# Patient Record
Sex: Female | Born: 1960 | Race: White | Hispanic: No | Marital: Single | Smoking: Never smoker
Health system: Southern US, Community
[De-identification: ages and names within clinical notes are randomized; demographics above are authoritative.]

## PROBLEM LIST (undated history)

## (undated) DIAGNOSIS — J45990 Exercise induced bronchospasm: Secondary | ICD-10-CM

## (undated) DIAGNOSIS — J309 Allergic rhinitis, unspecified: Secondary | ICD-10-CM

## (undated) DIAGNOSIS — R002 Palpitations: Secondary | ICD-10-CM

## (undated) HISTORY — PX: TONSILLECTOMY: SHX5217

## (undated) HISTORY — DX: Exercise induced bronchospasm: J45.990

## (undated) HISTORY — DX: Allergic rhinitis, unspecified: J30.9

## (undated) HISTORY — DX: Palpitations: R00.2

## (undated) HISTORY — PX: OTHER SURGICAL HISTORY: SHX169

---

## 1983-03-31 HISTORY — PX: TONSILLECTOMY: SHX5217

## 2003-08-15 ENCOUNTER — Encounter: Admission: RE | Admit: 2003-08-15 | Discharge: 2003-08-15 | Payer: Self-pay | Admitting: Family Medicine

## 2003-08-15 ENCOUNTER — Encounter (INDEPENDENT_AMBULATORY_CARE_PROVIDER_SITE_OTHER): Payer: Self-pay | Admitting: Specialist

## 2004-12-16 ENCOUNTER — Ambulatory Visit (HOSPITAL_COMMUNITY): Admission: RE | Admit: 2004-12-16 | Discharge: 2004-12-16 | Payer: Self-pay | Admitting: Family Medicine

## 2005-01-30 ENCOUNTER — Ambulatory Visit (HOSPITAL_COMMUNITY): Admission: RE | Admit: 2005-01-30 | Discharge: 2005-01-30 | Payer: Self-pay | Admitting: Obstetrics & Gynecology

## 2005-01-30 ENCOUNTER — Encounter (INDEPENDENT_AMBULATORY_CARE_PROVIDER_SITE_OTHER): Payer: Self-pay | Admitting: Specialist

## 2008-03-30 HISTORY — PX: BREAST BIOPSY: SHX20

## 2008-05-23 ENCOUNTER — Encounter: Admission: RE | Admit: 2008-05-23 | Discharge: 2008-05-23 | Payer: Self-pay | Admitting: Family Medicine

## 2009-04-11 LAB — HM PAP SMEAR: HM Pap smear: NORMAL

## 2009-07-28 LAB — HM MAMMOGRAPHY

## 2010-08-15 NOTE — Op Note (Signed)
Kristen Wilkerson, Kristen Wilkerson               ACCOUNT NO.:  [id number]   MEDICAL RECORD NO.:  [id number]          PATIENT TYPE:  AMB   LOCATION:  SDC                           FACILITY:  [facility]   PHYSICIAN:  Genia Del, M.D.DATE OF BIRTH:  Apr 15, 1960   DATE OF PROCEDURE:  01/30/2005  DATE OF DISCHARGE:                                 OPERATIVE REPORT   PREOPERATIVE DIAGNOSIS:  Menometrorrhagia with intrauterine lesion.   POSTOPERATIVE DIAGNOSIS:  Menometrorrhagia with intrauterine lesion.   INTERVENTION:  Hysteroscopy with resection of endometrial polyps and  dilatation and curettage.   SURGEON:  Genia Del, M.D.   ANESTHESIOLOGIST:  Octaviano Glow. Pamalee Leyden, M.D.   PROCEDURE:  Under MAC analgesia, the patient is in lithotomy position.  She  is prepped with Betadine on the suprapubic, vulvar and vaginal areas.  The  vaginal exam reveals an anteverted uterus, normal volume, mobile, no adnexal  mass.  The cervix is closed and long.  No vaginal bleeding.  The speculum is  inserted in the vagina.  The anterior lip of the cervix is grasped with a  tenaculum.  A paracervical block is done with Nesacaine 1% 20 mL total at 4  and 8 o'clock.  We then proceed with dilatation of the cervix with Hegar  dilators up to #33 without difficulty.  We then insert the operative  hysteroscope.  Pictures are taken before showing endometrial polyps on the  left anterior endometrial cavity.  We visualize both ostia and the whole  cavity is well-seen.  We then use the loop for resection of the polyps at  the base and they are sent separately to pathology.  We then take pictures  after resection showing a normal uterine cavity.  We use a sharp curette to  systematically curettage the whole intrauterine cavity on all surfaces.  That specimen of endometrial curettings is sent separately to pathology.  Hemostasis is adequate.  All instruments are removed.  The estimated blood  loss was minimal, and the fluid deficit  was 80 mL.  No complication  occurred, and the patient was transferred to recovery room in good status.      Genia Del, M.D.  Electronically Signed     ML/MEDQ  D:  01/30/2005  T:  01/30/2005  Job:  [id number]

## 2010-12-18 ENCOUNTER — Other Ambulatory Visit (HOSPITAL_COMMUNITY)
Admission: RE | Admit: 2010-12-18 | Discharge: 2010-12-18 | Disposition: A | Discharge status: Home / Self Care | Payer: BC Managed Care – PPO | Source: Ambulatory Visit | Attending: Family Medicine | Admitting: Family Medicine

## 2010-12-18 ENCOUNTER — Ambulatory Visit (INDEPENDENT_AMBULATORY_CARE_PROVIDER_SITE_OTHER): Payer: BC Managed Care – PPO | Admitting: Family Medicine

## 2010-12-18 ENCOUNTER — Encounter: Payer: Self-pay | Admitting: Family Medicine

## 2010-12-18 VITALS — BP 124/72 | HR 60 | Ht 68.0 in | Wt 137.0 lb

## 2010-12-18 DIAGNOSIS — Z01419 Encounter for gynecological examination (general) (routine) without abnormal findings: Secondary | ICD-10-CM | POA: Insufficient documentation

## 2010-12-18 DIAGNOSIS — Z0000 Encounter for general adult medical examination without abnormal findings: Secondary | ICD-10-CM

## 2010-12-18 DIAGNOSIS — J45990 Exercise induced bronchospasm: Secondary | ICD-10-CM

## 2010-12-18 DIAGNOSIS — Z113 Encounter for screening for infections with a predominantly sexual mode of transmission: Secondary | ICD-10-CM | POA: Insufficient documentation

## 2010-12-18 LAB — POCT URINALYSIS DIPSTICK
Bilirubin, UA: NEGATIVE
Blood, UA: NEGATIVE
Glucose, UA: NEGATIVE
Ketones, UA: NEGATIVE
Leukocytes, UA: NEGATIVE
Nitrite, UA: NEGATIVE
Protein, UA: NEGATIVE
Spec Grav, UA: 1.01
Urobilinogen, UA: NEGATIVE
pH, UA: 7

## 2010-12-18 MED ORDER — ALBUTEROL SULFATE HFA 108 (90 BASE) MCG/ACT IN AERS: 2.0000 | INHALATION_SPRAY | Freq: Four times a day (QID) | RESPIRATORY_TRACT | Status: DC | PRN

## 2010-12-18 NOTE — Progress Notes (Signed)
Kristen Wilkerson is a 50 y.o. female who presents for a complete physical.  She has the following concerns:  Rash R knee--has been there for about a month, moving downward on knee.  Not really itchy.  Possibly began after some yardwork.  Hasn't used OTC meds  Immunization History  Administered Date(s) Administered  . Tdap 01/29/2007  gets flu shots at work Last Pap smear: 04/11/2009 Last mammogram: 2011 (gets through work, was out of town 07/2010; plans to get 07/2011) Last colonoscopy: never Last DEXA: never Ophtho: yearly Dentist: twice a year Exercise: 6 days/week, cycles, runs, yoga, weights  Past Medical History  Diagnosis Date  . Exercise-induced asthma     related to weather changes/allergies; had normal stress echo for evaluation of DOE  . Allergic rhinitis, cause unspecified     Past Surgical History  Procedure Date  . Thyroid cyst excision 18  . Tonsillectomy 20  . Uterine cyst removal     History   Social History  . Marital Status: Single    Spouse Name: N/A    Number of Children: 0  . Years of Education: N/A   Occupational History  . management (product development) [employer]   Social History Main Topics  . Smoking status: Never Smoker   . Smokeless tobacco: Never Used  . Alcohol Use: Yes     3-4 drinks per week.  . Drug Use: No  . Sexually Active: Not on file   Other Topics Concern  . Not on file   Social History Narrative   Lives alone    Family History  Problem Relation Age of Onset  . Hypertension Mother   . Stroke Father   . Hypertension Father   . Heart disease Father     CHF  . Parkinsonism Father   . Hypertension Sister   . Hypertension Brother   . Diabetes Maternal Uncle   . Parkinsonism Maternal Uncle   . Diabetes Maternal Grandmother   . Diabetes Maternal Grandfather   . Hypertension Sister   . Migraines Brother     Current outpatient prescriptions:CALCIUM-MAGNESIUM-ZINC PO, Take 1 tablet by mouth daily.  , Disp: , Rfl:  ;  cholecalciferol (VITAMIN D) 1000 UNITS tablet, Take 1,000 Units by mouth daily.  , Disp: , Rfl: ;  loratadine (CLARITIN) 10 MG tablet, Take 10 mg by mouth daily.  , Disp: , Rfl: ;  Multiple Vitamin (MULTIVITAMIN) tablet, Take 1 tablet by mouth daily.  , Disp: , Rfl:  albuterol (PROVENTIL HFA;VENTOLIN HFA) 108 (90 BASE) MCG/ACT inhaler, Inhale 2 puffs into the lungs every 6 (six) hours as needed for wheezing (use 20 minutes prior to exercise and prn)., Disp: 36 g, Rfl: 2  No Known Allergies  ROS: The patient denies anorexia, fever, weight changes, headaches,  vision changes, decreased hearing, ear pain, sore throat, breast concerns, chest pain, palpitations, dizziness, syncope, dyspnea on exertion, cough, swelling, nausea, vomiting, diarrhea, constipation, abdominal pain, melena, hematochezia, indigestion/heartburn, hematuria, incontinence, dysuria, irregular menstrual cycles, vaginal discharge, odor or itch, genital lesions, joint pains, numbness, tingling, weakness, tremor, suspicious skin lesions, depression, anxiety, abnormal bleeding/bruising, or enlarged lymph nodes. Periods are light, every 6-8 weeks. Has had unprotected sex since last pap smear.   PHYSICAL EXAM: BP 124/72  Pulse 60  Ht 5\' 8"  (1.727 m)  Wt 137 lb (62.143 kg)  BMI 20.83 kg/m2  LMP 12/11/2010  General Appearance:    Alert, cooperative, no distress, appears stated age  Head:    Normocephalic,  without obvious abnormality, atraumatic  Eyes:    PERRL, conjunctiva/corneas clear, EOM's intact, fundi    benign  Ears:    Normal TM's and external ear canals  Nose:   Nares normal, mucosa normal, no drainage or sinus   tenderness  Throat:   Lips, mucosa, and tongue normal; teeth and gums normal  Neck:   Supple, no lymphadenopathy;  thyroid:  no   enlargement/tenderness/nodules; no carotid   bruit or JVD  Back:    Spine nontender, no curvature, ROM normal, no CVA     tenderness  Lungs:     Clear to auscultation bilaterally  without wheezes, rales or     ronchi; respirations unlabored  Chest Wall:    No tenderness or deformity   Heart:    Regular rate and rhythm, S1 and S2 normal, no murmur, rub   or gallop  Breast Exam:    No tenderness, masses, or nipple discharge or inversion.      No axillary lymphadenopathy  Abdomen:     Soft, non-tender, nondistended, normoactive bowel sounds,    no masses, no hepatosplenomegaly  Genitalia:    Normal external genitalia without lesions.  BUS and vagina normal; cervix without lesions, or cervical motion tenderness. No abnormal vaginal discharge.  Uterus and adnexa not enlarged, nontender, no masses.  Pap performed  Rectal:    Normal tone, no masses or tenderness; guaiac negative stool  Extremities:   No clubbing, cyanosis or edema  Pulses:   2+ and symmetric all extremities  Skin:   Skin color, texture, turgor normal. Significant solar damage, no suspicious lesions.  Cluster of papules, small vesicles at R knee  Lymph nodes:   Cervical, supraclavicular, and axillary nodes normal  Neurologic:   CNII-XII intact, normal strength, sensation and gait; reflexes 2+ and symmetric throughout          Psych:   Normal mood, affect, hygiene and grooming.    ASSESSMENT/PLAN:  1. Routine general medical examination at a health care facility  POCT Urinalysis Dipstick, Visual acuity screening, Cytology - PAP  2. Exercise-induced asthma  albuterol (PROVENTIL HFA;VENTOLIN HFA) 108 (90 BASE) MCG/ACT inhaler  Rash R knee--likely a contact dermatitis, mild.  Recommended OTC cortisone--f/u if worsening  Discussed monthly self breast exams and yearly mammograms after the age of 4; at least 30 minutes of aerobic activity at least 5 days/week; proper sunscreen use reviewed; healthy diet, including goals of calcium and vitamin D intake and alcohol recommendations (less than or equal to 1 drink/day) reviewed; regular seatbelt use; changing batteries in smoke detectors.  Immunization recommendations  discussed--to get flu shot at work.  Colonoscopy recommendations reviewed--age 44  Stool cards given No labs done today--labs 03/2009 had normal CBC, c-met. TC 163, HDL 90, LDL 63, TG 52, normal TSH

## 2010-12-18 NOTE — Patient Instructions (Addendum)
HEALTH MAINTENANCE RECOMMENDATIONS:  It is recommended that you get at least 30 minutes of aerobic exercise at least 5 days/week (for weight loss, you may need as much as 60-90 minutes). This can be any activity that gets your heart rate up. This can be divided in 10-15 minute intervals if needed, but try and build up your endurance at least once a week.  Weight bearing exercise is also recommended twice weekly.  Eat a healthy diet with lots of vegetables, fruits and fiber.  "Colorful" foods have a lot of vitamins (ie green vegetables, tomatoes, red peppers, etc).  Limit sweet tea, regular sodas and alcoholic beverages, all of which has a lot of calories and sugar.  Up to 1 alcoholic drink daily may be beneficial for women (unless trying to lose weight, watch sugars).  Drink a lot of water.  Calcium recommendations are 1200-1500 mg daily (1500 mg for postmenopausal women or women without ovaries), and vitamin D 1000 IU daily.  This should be obtained from diet and/or supplements (vitamins), and calcium should not be taken all at once, but in divided doses.  Monthly self breast exams and yearly mammograms for women over the age of 46 is recommended.  Sunscreen of at least SPF 30 should be used on all sun-exposed parts of the skin when outside between the hours of 10 am and 4 pm (not just when at beach or pool, but even with exercise, golf, tennis, and yard work!)  Use a sunscreen that says "broad spectrum" so it covers both UVA and UVB rays, and make sure to reapply every 1-2 hours.  Remember to change the batteries in your smoke detectors when changing your clock times in the spring and fall.  Use your seat belt every time you are in a car, and please drive safely and not be distracted with cell phones and texting while driving.  Use condoms for prevention of sexually transmitted diseases

## 2010-12-22 ENCOUNTER — Encounter: Payer: Self-pay | Admitting: Family Medicine

## 2011-08-10 ENCOUNTER — Encounter: Payer: Self-pay | Admitting: *Deleted

## 2011-08-17 ENCOUNTER — Telehealth: Payer: Self-pay | Admitting: Family Medicine

## 2011-08-17 NOTE — Telephone Encounter (Signed)
Okay for order to breast center--?what needed so order not specifically put in computer

## 2011-08-17 NOTE — Telephone Encounter (Signed)
Pt called they had a mobile unit mammogram come out to her work.  The report came back that she need further follow up.  Pt req referral to the Breast Center.

## 2011-08-18 NOTE — Telephone Encounter (Signed)
Called the breast center and send over mammogram report and they were going to call pt to schedule appt and fax over a referral sign for Dr. Lynelle Doctor to sign. It is okay to use stamp with Dr. Lynelle Doctor signature on it and it will also have scheduled time and date that the pt will have appt so i can document in computer when received from referral paper

## 2011-08-19 ENCOUNTER — Other Ambulatory Visit: Payer: Self-pay | Admitting: Family Medicine

## 2011-08-19 DIAGNOSIS — R928 Other abnormal and inconclusive findings on diagnostic imaging of breast: Secondary | ICD-10-CM

## 2011-08-20 NOTE — Telephone Encounter (Signed)
Pt has appt with the breast center may 28th,2013 @2 :20pm

## 2011-08-25 ENCOUNTER — Ambulatory Visit
Admission: RE | Admit: 2011-08-25 | Discharge: 2011-08-25 | Disposition: A | Discharge status: Home / Self Care | Payer: BC Managed Care – PPO | Source: Ambulatory Visit | Attending: Family Medicine | Admitting: Family Medicine

## 2011-08-25 DIAGNOSIS — R928 Other abnormal and inconclusive findings on diagnostic imaging of breast: Secondary | ICD-10-CM

## 2012-02-04 ENCOUNTER — Encounter: Payer: Self-pay | Admitting: Cardiology

## 2012-04-22 ENCOUNTER — Ambulatory Visit
Admission: RE | Admit: 2012-04-22 | Discharge: 2012-04-22 | Disposition: A | Discharge status: Home / Self Care | Payer: BC Managed Care – PPO | Source: Ambulatory Visit | Attending: Family Medicine | Admitting: Family Medicine

## 2012-04-22 ENCOUNTER — Other Ambulatory Visit: Payer: Self-pay | Admitting: Family Medicine

## 2012-04-22 DIAGNOSIS — M79674 Pain in right toe(s): Secondary | ICD-10-CM

## 2014-06-24 ENCOUNTER — Ambulatory Visit (INDEPENDENT_AMBULATORY_CARE_PROVIDER_SITE_OTHER): Payer: BLUE CROSS/BLUE SHIELD | Admitting: Urgent Care

## 2014-06-24 VITALS — BP 100/70 | HR 76 | Temp 98.6°F | Resp 16 | Ht 68.0 in | Wt 139.1 lb

## 2014-06-24 DIAGNOSIS — R509 Fever, unspecified: Secondary | ICD-10-CM

## 2014-06-24 DIAGNOSIS — J029 Acute pharyngitis, unspecified: Secondary | ICD-10-CM

## 2014-06-24 DIAGNOSIS — M791 Myalgia, unspecified site: Secondary | ICD-10-CM

## 2014-06-24 DIAGNOSIS — J101 Influenza due to other identified influenza virus with other respiratory manifestations: Secondary | ICD-10-CM | POA: Diagnosis not present

## 2014-06-24 LAB — POCT CBC
Granulocyte percent: 77.8 %G (ref 37–80)
HCT, POC: 41.1 % (ref 37.7–47.9)
Hemoglobin: 13.5 g/dL (ref 12.2–16.2)
Lymph, poc: 0.6 (ref 0.6–3.4)
MCH, POC: 31.3 pg — AB (ref 27–31.2)
MCHC: 32.8 g/dL (ref 31.8–35.4)
MCV: 95.5 fL (ref 80–97)
MID (cbc): 0.4 (ref 0–0.9)
MPV: 7 fL (ref 0–99.8)
POC Granulocyte: 3.6 (ref 2–6.9)
POC LYMPH PERCENT: 12.5 %L (ref 10–50)
POC MID %: 9.7 %M (ref 0–12)
Platelet Count, POC: 204 10*3/uL (ref 142–424)
RBC: 4.31 M/uL (ref 4.04–5.48)
RDW, POC: 13.8 %
WBC: 4.6 10*3/uL (ref 4.6–10.2)

## 2014-06-24 LAB — POCT INFLUENZA A/B
Influenza A, POC: POSITIVE
Influenza B, POC: NEGATIVE

## 2014-06-24 MED ORDER — HYDROCODONE-HOMATROPINE 5-1.5 MG/5ML PO SYRP: 5.0000 mL | ORAL_SOLUTION | Freq: Every evening | ORAL | Status: DC | PRN

## 2014-06-24 MED ORDER — OSELTAMIVIR PHOSPHATE 75 MG PO CAPS: 75.0000 mg | ORAL_CAPSULE | Freq: Two times a day (BID) | ORAL | Status: DC

## 2014-06-24 NOTE — Progress Notes (Signed)
MRN: [id number] DOB: [birth year]  Subjective:   Kristen Wilkerson is a 54 y.o. female presenting for chief complaint of Sore Throat; Fever; and Generalized Body Aches  Reports 2 day history of bilateral ear pain, fever (100F), sore throat, myalgias, headache, sinus pressure; dry cough and chest congestion today. Sore throat has actually been there for 2 weeks, has been taking loratadine intermittently for seasonal allergies with minimal relief. Has also been trying NyQuil and DayQuil with some relief. Denies chest pain, chest tightness, shob, wheezing, n/v, abdominal, diarrhea. Plenty of sick contacts at work. History of seasonal allergies, history exercise-induced asthma. Patient did receive flu shot this year 12/2013. Denies smoking, occasional alcohol drink. Denies any other aggravating or relieving factors, no other questions or concerns.  Kristen Wilkerson has a current medication list which includes the following prescription(s): albuterol, calcium-magnesium-zinc, cholecalciferol, fluticasone, multivitamin, and loratadine. She has No Known Allergies.  Kristen Wilkerson  has a past medical history of Exercise-induced asthma; Allergic rhinitis, cause unspecified; DOE (dyspnea on exertion); and Palpitations. Also  has past surgical history that includes Thyroid cyst excision (18); Tonsillectomy (19); and uterine cyst removal.  ROS As in subjective.  Objective:   Vitals: BP 100/70 mmHg  Pulse 76  Temp(Src) 98.6 F (37 C) (Oral)  Resp 16  Ht  (1.727 m)  Wt 139 lb 2 oz (63.107 kg)  BMI 21.16 kg/m2  SpO2 99%  LMP 12/11/2010  Physical Exam  Constitutional: She is oriented to person, place, and time and well-developed, well-nourished, and in no distress.  HENT:  TM's flat bilaterally, no effusions or erythema. Nasal turbinates inflamed and erythematous with whitish-yellow rhinorrhea. No sinus tenderness. Postnasal drip present, without oropharyngeal exudates, erythema or abscesses.  Eyes: Conjunctivae  are normal. Right eye exhibits no discharge. Left eye exhibits no discharge. No scleral icterus.  Cardiovascular: Normal rate, regular rhythm and intact distal pulses.  Exam reveals no gallop and no friction rub.   No murmur heard. Pulmonary/Chest: No stridor. No respiratory distress. She has no wheezes. She has no rales. She exhibits no tenderness.  Lymphadenopathy:    She has cervical adenopathy (bilateral, anterior).  Neurological: She is alert and oriented to person, place, and time.  Skin: Skin is warm and dry. No rash noted. No erythema. No pallor.   Results for orders placed or performed in visit on 06/24/14 (from the past 24 hour(s))  POCT Influenza A/B     Status: None   Collection Time: 06/24/14 12:39 PM  Result Value Ref Range   Influenza A, POC Positive    Influenza B, POC Negative   POCT CBC     Status: Abnormal   Collection Time: 06/24/14 12:46 PM  Result Value Ref Range   WBC 4.6 4.6 - 10.2 K/uL   Lymph, poc 0.6 0.6 - 3.4   POC LYMPH PERCENT 12.5 10 - 50 %L   MID (cbc) 0.4 0 - 0.9   POC MID % 9.7 0 - 12 %M   POC Granulocyte 3.6 2 - 6.9   Granulocyte percent 77.8 37 - 80 %G   RBC 4.31 4.04 - 5.48 M/uL   Hemoglobin 13.5 12.2 - 16.2 g/dL   HCT, POC 95.6 21.3 - 47.9 %   MCV 95.5 80 - 97 fL   MCH, POC 31.3 (A) 27 - 31.2 pg   MCHC 32.8 31.8 - 35.4 g/dL   RDW, POC 08.6 %   Platelet Count, POC 204 142 - 424 K/uL   MPV 7.0 0 -  99.8 fL    Assessment and Plan :   1. Fever, unspecified fever cause 2. Myalgia 3. Influenza A 4. Sore throat - Start oseltamivir for 5 days, advised supportive care, Hycodan for sore throat, cough - Advised to restart oral antihistamine, nasal steroid, netty pot as needed - Return to clinic if symptoms fail to resolve in 5 days  Wallis BambergMario Asya Derryberry, PA-C [facility] [phone] 06/24/2014 12:18 PM

## 2014-06-24 NOTE — Patient Instructions (Addendum)

## 2015-03-31 HISTORY — PX: THYROID CYST EXCISION: SHX2511

## 2015-09-25 DIAGNOSIS — Z1231 Encounter for screening mammogram for malignant neoplasm of breast: Secondary | ICD-10-CM | POA: Diagnosis not present

## 2015-12-25 DIAGNOSIS — Z23 Encounter for immunization: Secondary | ICD-10-CM | POA: Diagnosis not present

## 2016-08-05 DIAGNOSIS — M7542 Impingement syndrome of left shoulder: Secondary | ICD-10-CM | POA: Diagnosis not present

## 2016-09-02 DIAGNOSIS — M7542 Impingement syndrome of left shoulder: Secondary | ICD-10-CM | POA: Diagnosis not present

## 2016-09-14 DIAGNOSIS — M7542 Impingement syndrome of left shoulder: Secondary | ICD-10-CM | POA: Diagnosis not present

## 2016-10-07 DIAGNOSIS — Z1231 Encounter for screening mammogram for malignant neoplasm of breast: Secondary | ICD-10-CM | POA: Diagnosis not present

## 2016-12-07 ENCOUNTER — Ambulatory Visit (HOSPITAL_COMMUNITY)
Admission: EM | Admit: 2016-12-07 | Discharge: 2016-12-07 | Disposition: A | Discharge status: Home / Self Care | Payer: BLUE CROSS/BLUE SHIELD | Attending: Family Medicine | Admitting: Family Medicine

## 2016-12-07 ENCOUNTER — Ambulatory Visit (INDEPENDENT_AMBULATORY_CARE_PROVIDER_SITE_OTHER): Payer: BLUE CROSS/BLUE SHIELD

## 2016-12-07 ENCOUNTER — Encounter (HOSPITAL_COMMUNITY): Payer: Self-pay | Admitting: Emergency Medicine

## 2016-12-07 DIAGNOSIS — S62111A Displaced fracture of triquetrum [cuneiform] bone, right wrist, initial encounter for closed fracture: Secondary | ICD-10-CM | POA: Diagnosis not present

## 2016-12-07 DIAGNOSIS — S62101A Fracture of unspecified carpal bone, right wrist, initial encounter for closed fracture: Secondary | ICD-10-CM | POA: Diagnosis not present

## 2016-12-07 NOTE — ED Provider Notes (Signed)
[facility]    CSN: [id number] Arrival date & time: 12/07/16  1726     History   Chief Complaint Chief Complaint  Patient presents with  . Wrist Pain    HPI Kristen Wilkerson is a 56 y.o. female.   56 year old female comes in for right wrist pain after falling 1 week ago. She states that she was hiking, slipped on a stone, fell on right side of her body, with hand outstretched. Denies head injury, loss of consciousness. She has been taking ibuprofen 400 mg 3 times a day since event with good relief. If she continues to ice compress with good relief. She initially had swelling at the dorsal of her hand, but has since resolved. She states the symptoms are improving, but continues to have decrease in range of motion, and pain with movement. States she does not experienced pain without movement. She has had some intermittent generalized numbness tingling of the fingers.       Past Medical History:  Diagnosis Date  . Allergic rhinitis, cause unspecified   . DOE (dyspnea on exertion)   . Exercise-induced asthma    related to weather changes/allergies; had normal stress echo for evaluation of DOE  . Palpitations     There are no active problems to display for this patient.   Past Surgical History:  Procedure Laterality Date  . THYROID CYST EXCISION  18  . TONSILLECTOMY  19  . uterine cyst removal      OB History    No data available       Home Medications    Prior to Admission medications   Medication Sig Start Date End Date Taking? Authorizing Provider  albuterol (PROVENTIL HFA;VENTOLIN HFA) 108 (90 BASE) MCG/ACT inhaler Inhale 2 puffs into the lungs every 6 (six) hours as needed for wheezing (use 20 minutes prior to exercise and prn). 12/18/10   Joselyn ArrowKnapp, Eve, MD  CALCIUM-MAGNESIUM-ZINC PO Take 1 tablet by mouth daily.      [provider]  cholecalciferol (VITAMIN D) 1000 UNITS tablet Take 1,000 Units by mouth daily.      [provider]    fluticasone (FLONASE) 50 MCG/ACT nasal spray Place 1 spray into both nostrils daily.    [provider]  HYDROcodone-homatropine (HYCODAN) 5-1.5 MG/5ML syrup Take 5 mLs by mouth at bedtime as needed. 06/24/14   Wallis BambergMani, Mario, PA-C  loratadine (CLARITIN) 10 MG tablet Take 10 mg by mouth daily.      [provider]  Multiple Vitamin (MULTIVITAMIN) tablet Take 1 tablet by mouth daily.      [provider]  oseltamivir (TAMIFLU) 75 MG capsule Take 1 capsule (75 mg total) by mouth 2 (two) times daily. 06/24/14   Wallis BambergMani, Mario, PA-C    Family History Family History  Problem Relation Age of Onset  . Hypertension Mother   . Stroke Father   . Hypertension Father   . Heart disease Father        CHF  . Parkinsonism Father   . Hypertension Sister   . Hypertension Brother   . Hypertension Sister   . Migraines Brother   . Diabetes Maternal Uncle   . Parkinsonism Maternal Uncle   . Diabetes Maternal Grandmother   . Heart disease Maternal Grandmother   . Stroke Maternal Grandmother   . Diabetes Maternal Grandfather   . Stroke Maternal Grandfather   . Heart disease Paternal Grandmother     Social History Social History  Substance Use Topics  .  Smoking status: Never Smoker  . Smokeless tobacco: Never Used  . Alcohol use Yes     Comment: 3-4 drinks per week.     Allergies   Patient has no known allergies.   Review of Systems Review of Systems  Reason unable to perform ROS: See HPI as above.     Physical Exam Triage Vital Signs ED Triage Vitals  Enc Vitals Group     BP 12/07/16 1811 122/72     Pulse Rate 12/07/16 1811 (!) 54     Resp 12/07/16 1811 16     Temp 12/07/16 1811 98.2 F (36.8 C)     Temp Source 12/07/16 1811 Oral     SpO2 12/07/16 1811 100 %     Weight 12/07/16 1810 135 lb (61.2 kg)     Height 12/07/16 1810  (1.753 m)     Head Circumference --      Peak Flow --      Pain Score 12/07/16 1810 4     Pain Loc --      Pain Edu? --       Excl. in GC? --    No data found.   Updated Vital Signs BP 122/72   Pulse (!) 54   Temp 98.2 F (36.8 C) (Oral)   Resp 16   Ht  (1.753 m)   Wt 135 lb (61.2 kg)   LMP 12/11/2010   SpO2 100%   BMI 19.94 kg/m     Physical Exam  Constitutional: She is oriented to person, place, and time. She appears well-developed and well-nourished. No distress.  HENT:  Head: Normocephalic and atraumatic.  Eyes: Pupils are equal, round, and reactive to light. Conjunctivae are normal.  Musculoskeletal:  No tenderness on palpation of bilateral elbows. Tenderness on palpation of ulnar aspect of wrist. Tenderness on palpation of proximal MTPs, most significant at the second and third. Decreased extension of the right wrist. Strength normal and equal bilaterally. Sensation intact and equal bilaterally. Radial pulses 2+ and equal bilaterally. Cap refill less than 2 seconds.  Neurological: She is alert and oriented to person, place, and time.     UC Treatments / Results  Labs (all labs ordered are listed, but only abnormal results are displayed) Labs Reviewed - No data to display  EKG  EKG Interpretation None       Radiology Dg Wrist Complete Right  Result Date: 12/07/2016 CLINICAL DATA:  Fall 1 week ago.  Right wrist injury. EXAM: RIGHT WRIST - COMPLETE 3+ VIEW COMPARISON:  None FINDINGS: Small bone fragment noted posteriorly on the lateral view concerning for triquetrum avulsion fracture. No additional fracture. Soft tissues are intact. IMPRESSION: Triquetrum avulsion fracture posteriorly. Electronically Signed   By: Charlett Nose M.D.   On: 12/07/2016 19:25    Procedures Procedures (including critical care time)  Medications Ordered in UC Medications - No data to display   Initial Impression / Assessment and Plan / UC Course  I have reviewed the triage vital signs and the nursing notes.  Pertinent labs & imaging results that were available during my care of the patient were  reviewed by me and considered in my medical decision making (see chart for details).     Discussed x-ray results with patient. Wear wrist brace. Continue NSAIDs as directed. Patient declined stronger pain medication. Follow up with orthopedic for further evaluation and treatment of avulsion fracture. Return precautions given.  Final Clinical Impressions(s) / UC Diagnoses   Final diagnoses:  Closed fracture of right wrist, initial encounter    New Prescriptions Discharge Medication List as of 12/07/2016  7:35 PM        Belinda Fisher, PA-C 12/07/16 2102

## 2016-12-07 NOTE — Discharge Instructions (Signed)
There was a small fracture on your wrist. Continue ibuprofen 400-800mg  three times a day for pain and inflammation. Ice compress as needed. Wear wrist splint and follow up with orthopedics for further evaluation and management.

## 2016-12-07 NOTE — ED Triage Notes (Signed)
PT reports a fall with right wrist injury 1 week ago. PT reports swelling and pain have improved, but still has some problem areas and limited ROM.

## 2017-02-17 ENCOUNTER — Encounter: Payer: BLUE CROSS/BLUE SHIELD | Admitting: Family Medicine

## 2017-03-03 DIAGNOSIS — M25531 Pain in right wrist: Secondary | ICD-10-CM | POA: Diagnosis not present

## 2017-03-30 HISTORY — PX: TONSILLECTOMY: SHX5217

## 2017-04-14 ENCOUNTER — Encounter: Payer: BLUE CROSS/BLUE SHIELD | Admitting: Family Medicine

## 2017-04-26 ENCOUNTER — Other Ambulatory Visit: Payer: Self-pay

## 2017-04-26 ENCOUNTER — Ambulatory Visit (INDEPENDENT_AMBULATORY_CARE_PROVIDER_SITE_OTHER): Payer: BLUE CROSS/BLUE SHIELD | Admitting: Family Medicine

## 2017-04-26 ENCOUNTER — Encounter: Payer: Self-pay | Admitting: Family Medicine

## 2017-04-26 VITALS — BP 116/72 | HR 89 | Resp 16 | Ht 70.08 in | Wt 142.0 lb

## 2017-04-26 DIAGNOSIS — Z13220 Encounter for screening for lipoid disorders: Secondary | ICD-10-CM | POA: Diagnosis not present

## 2017-04-26 DIAGNOSIS — Z124 Encounter for screening for malignant neoplasm of cervix: Secondary | ICD-10-CM

## 2017-04-26 DIAGNOSIS — Z23 Encounter for immunization: Secondary | ICD-10-CM

## 2017-04-26 DIAGNOSIS — Z131 Encounter for screening for diabetes mellitus: Secondary | ICD-10-CM

## 2017-04-26 DIAGNOSIS — Z1211 Encounter for screening for malignant neoplasm of colon: Secondary | ICD-10-CM

## 2017-04-26 DIAGNOSIS — Z114 Encounter for screening for human immunodeficiency virus [HIV]: Secondary | ICD-10-CM | POA: Diagnosis not present

## 2017-04-26 DIAGNOSIS — Z1159 Encounter for screening for other viral diseases: Secondary | ICD-10-CM | POA: Diagnosis not present

## 2017-04-26 DIAGNOSIS — Z0000 Encounter for general adult medical examination without abnormal findings: Secondary | ICD-10-CM | POA: Diagnosis not present

## 2017-04-26 LAB — POCT URINALYSIS DIP (MANUAL ENTRY)
Bilirubin, UA: NEGATIVE
Blood, UA: NEGATIVE
Glucose, UA: NEGATIVE mg/dL
Ketones, POC UA: NEGATIVE mg/dL
Nitrite, UA: NEGATIVE
Protein Ur, POC: NEGATIVE mg/dL
Spec Grav, UA: 1.015 (ref 1.010–1.025)
Urobilinogen, UA: 0.2 E.U./dL
pH, UA: 7.5 (ref 5.0–8.0)

## 2017-04-26 NOTE — Patient Instructions (Addendum)
IF you received an x-ray today, you will receive an invoice from [facility]. Please contact [facility] at [phone] with questions or concerns regarding your invoice.   IF you received labwork today, you will receive an invoice from [facility]. Please contact [facility] at [phone] with questions or concerns regarding your invoice.   Our billing staff will not be able to assist you with questions regarding bills from these companies.  You will be contacted with the lab results as soon as they are available. The fastest way to get your results is to activate your My Chart account. Instructions are located on the last page of this paperwork. If you have not heard from [place] regarding the results in 2 weeks, please contact this office.      Preventive Care 40-64 Years, Female Preventive care refers to lifestyle choices and visits with your health care provider that can promote health and wellness. What does preventive care include?  A yearly physical exam. This is also called an annual well check.  Dental exams once or twice a year.  Routine eye exams. Ask your health care provider how often you should have your eyes checked.  Personal lifestyle choices, including: ? Daily care of your teeth and gums. ? Regular physical activity. ? Eating a healthy diet. ? Avoiding tobacco and drug use. ? Limiting alcohol use. ? Practicing safe sex. ? Taking low-dose aspirin daily starting at age 34. ? Taking vitamin and mineral supplements as recommended by your health care provider. What happens during an annual well check? The services and screenings done by your health care provider during your annual well check will depend on your age, overall health, lifestyle risk factors, and family history of disease. Counseling Your health care provider may ask you questions about your:  Alcohol use.  Tobacco use.  Drug use.  Emotional well-being.  Home and relationship  well-being.  Sexual activity.  Eating habits.  Work and work Statistician.  Method of birth control.  Menstrual cycle.  Pregnancy history.  Screening You may have the following tests or measurements:  Height, weight, and BMI.  Blood pressure.  Lipid and cholesterol levels. These may be checked every 5 years, or more frequently if you are over 65 years old.  Skin check.  Lung cancer screening. You may have this screening every year starting at age 102 if you have a 30-pack-year history of smoking and currently smoke or have quit within the past 15 years.  Fecal occult blood test (FOBT) of the stool. You may have this test every year starting at age 33.  Flexible sigmoidoscopy or colonoscopy. You may have a sigmoidoscopy every 5 years or a colonoscopy every 10 years starting at age 23.  Hepatitis C blood test.  Hepatitis B blood test.  Sexually transmitted disease (STD) testing.  Diabetes screening. This is done by checking your blood sugar (glucose) after you have not eaten for a while (fasting). You may have this done every 1-3 years.  Mammogram. This may be done every 1-2 years. Talk to your health care provider about when you should start having regular mammograms. This may depend on whether you have a family history of breast cancer.  BRCA-related cancer screening. This may be done if you have a family history of breast, ovarian, tubal, or peritoneal cancers.  Pelvic exam and Pap test. This may be done every 3 years starting at age 3. Starting at age 55, this may be done every 5 years if  you have a Pap test in combination with an HPV test.  Bone density scan. This is done to screen for osteoporosis. You may have this scan if you are at high risk for osteoporosis.  Discuss your test results, treatment options, and if necessary, the need for more tests with your health care provider. Vaccines Your health care provider may recommend certain vaccines, such  as:  Influenza vaccine. This is recommended every year.  Tetanus, diphtheria, and acellular pertussis (Tdap, Td) vaccine. You may need a Td booster every 10 years.  Varicella vaccine. You may need this if you have not been vaccinated.  Zoster vaccine. You may need this after age 77.  Measles, mumps, and rubella (MMR) vaccine. You may need at least one dose of MMR if you were born in 1957 or later. You may also need a second dose.  Pneumococcal 13-valent conjugate (PCV13) vaccine. You may need this if you have certain conditions and were not previously vaccinated.  Pneumococcal polysaccharide (PPSV23) vaccine. You may need one or two doses if you smoke cigarettes or if you have certain conditions.  Meningococcal vaccine. You may need this if you have certain conditions.  Hepatitis A vaccine. You may need this if you have certain conditions or if you travel or work in places where you may be exposed to hepatitis A.  Hepatitis B vaccine. You may need this if you have certain conditions or if you travel or work in places where you may be exposed to hepatitis B.  Haemophilus influenzae type b (Hib) vaccine. You may need this if you have certain conditions.  Talk to your health care provider about which screenings and vaccines you need and how often you need them. This information is not intended to replace advice given to you by your health care provider. Make sure you discuss any questions you have with your health care provider. Document Released: 04/12/2015 Document Revised: 12/04/2015 Document Reviewed: 01/15/2015 Elsevier Interactive Patient Education  [employer].

## 2017-04-26 NOTE — Progress Notes (Signed)
Subjective:    Patient ID: Kristen Wilkerson, female    DOB: 06-Feb-1961, 57 y.o.   MRN: [id number]  04/26/2017  Annual Exam    HPI This 57 y.o. female presents for Routine Physical Examination and to establish care.  Last physical:  2015 Pap smear:  2012; no abnormal pap smears; LMP 4 years ago. Mammogram:  08/2015; at work 2018 Colonoscopy: never yet agreeable. Eye exam:; due; 3 years; contacts Dental exam:  Every six months.   Visual Acuity Screening   Right eye Left eye Both eyes  Without correction:     With correction: 20/40 20/40 20/25     BP Readings from Last 3 Encounters:  04/26/17 116/72  12/07/16 122/72  06/24/14 100/70   Wt Readings from Last 3 Encounters:  04/26/17 142 lb (64.4 kg)  12/07/16 135 lb (61.2 kg)  06/24/14 139 lb 2 oz (63.1 kg)   Immunization History  Administered Date(s) Administered  . Influenza-Unspecified 12/28/2016  . Tdap 01/29/2007   Health Maintenance  Topic Date Due  . Hepatitis C Screening  1961-03-05  . HIV Screening  07/23/1976  . COLONOSCOPY  07/24/2011  . PAP SMEAR  12/17/2013  . TETANUS/TDAP  01/28/2017  . MAMMOGRAM  09/15/2018  . INFLUENZA VACCINE  Completed    Review of Systems  Constitutional: Negative for activity change, appetite change, chills, diaphoresis, fatigue, fever and unexpected weight change.  HENT: Negative for congestion, dental problem, drooling, ear discharge, ear pain, facial swelling, hearing loss, mouth sores, nosebleeds, postnasal drip, rhinorrhea, sinus pressure, sneezing, sore throat, tinnitus, trouble swallowing and voice change.   Eyes: Negative for photophobia, pain, discharge, redness, itching and visual disturbance.  Respiratory: Negative for apnea, cough, choking, chest tightness, shortness of breath, wheezing and stridor.   Cardiovascular: Negative for chest pain, palpitations and leg swelling.  Gastrointestinal: Negative for abdominal distention, abdominal pain, anal bleeding, blood in  stool, constipation, diarrhea, nausea, rectal pain and vomiting.  Endocrine: Negative for cold intolerance, heat intolerance, polydipsia, polyphagia and polyuria.  Genitourinary: Negative for decreased urine volume, difficulty urinating, dyspareunia, dysuria, enuresis, flank pain, frequency, genital sores, hematuria, menstrual problem, pelvic pain, urgency, vaginal bleeding, vaginal discharge and vaginal pain.  Musculoskeletal: Negative for arthralgias, back pain, gait problem, joint swelling, myalgias, neck pain and neck stiffness.  Skin: Negative for color change, pallor, rash and wound.  Allergic/Immunologic: Negative for environmental allergies, food allergies and immunocompromised state.  Neurological: Negative for dizziness, tremors, seizures, syncope, facial asymmetry, speech difficulty, weakness, light-headedness, numbness and headaches.  Hematological: Negative for adenopathy. Does not bruise/bleed easily.  Psychiatric/Behavioral: Negative for agitation, behavioral problems, confusion, decreased concentration, dysphoric mood, hallucinations, self-injury, sleep disturbance and suicidal ideas. The patient is not nervous/anxious and is not hyperactive.     Past Medical History:  Diagnosis Date  . Allergic rhinitis, cause unspecified    Claritin, Flonase seasonally  . Exercise-induced asthma    related to weather changes/allergies; had normal stress echo for evaluation of DOE  . Palpitations    Past Surgical History:  Procedure Laterality Date  . THYROID CYST EXCISION  17  . TONSILLECTOMY  19  . uterine cyst removal     No Known Allergies Current Outpatient Medications on File Prior to Visit  Medication Sig Dispense Refill  . albuterol (PROVENTIL HFA;VENTOLIN HFA) 108 (90 BASE) MCG/ACT inhaler Inhale 2 puffs into the lungs every 6 (six) hours as needed for wheezing (use 20 minutes prior to exercise and prn). 36 g 2  . CALCIUM-MAGNESIUM-ZINC PO Take 1  tablet by mouth daily.      .  cholecalciferol (VITAMIN D) 1000 UNITS tablet Take 1,000 Units by mouth daily.      Marland Kitchen loratadine (CLARITIN) 10 MG tablet Take 10 mg by mouth daily.      . Multiple Vitamin (MULTIVITAMIN) tablet Take 1 tablet by mouth daily.       No current facility-administered medications on file prior to visit.    Social History   Socioeconomic History  . Marital status: Single    Spouse name: Not on file  . Number of children: 0  . Years of education: Not on file  . Highest education level: Not on file  Social Needs  . Financial resource strain: Not on file  . Food insecurity - worry: Not on file  . Food insecurity - inability: Not on file  . Transportation needs - medical: Not on file  . Transportation needs - non-medical: Not on file  Occupational History  . Occupation: management (product development)    Employer: [employer]  Tobacco Use  . Smoking status: Never Smoker  . Smokeless tobacco: Never Used  Substance and Sexual Activity  . Alcohol use: Yes    Comment: 3-4 drinks per week.  . Drug use: No  . Sexual activity: Not on file  Other Topics Concern  . 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      Tobacco: none      Alcohol: 3-5 alcoholic beverages per week      Exercise: hike, walking, biking, running, yoga; daily exercise; gym membership      Seatbelt: 100%   Family History  Problem Relation Age of Onset  . Hypertension Mother   . Hyperlipidemia Mother   . Stroke Father 54  . Hypertension Father   . Heart disease Father        CHF  . Parkinsonism Father   . Hypertension Sister   . Hypertension Brother   . Hypertension Sister   . Migraines Brother   . Diabetes Maternal Uncle   . Parkinsonism Maternal Uncle   . Diabetes Maternal Grandmother   . Heart disease Maternal Grandmother     . Stroke Maternal Grandmother   . Diabetes Maternal Grandfather   . Stroke Maternal Grandfather   . Heart disease Paternal Grandmother        Objective:    BP 116/72   Pulse 89   Resp 16   Ht 5' 10.08" (1.78 m)   Wt 142 lb (64.4 kg)   LMP 12/11/2010   SpO2 98%   BMI 20.33 kg/m  Physical Exam  Constitutional: She is oriented to person, place, and time. She appears well-developed and well-nourished. No distress.  HENT:  Head: Normocephalic and atraumatic.  Right Ear: Hearing, tympanic membrane, external ear and ear canal normal.  Left Ear: Hearing, tympanic membrane, external ear and ear canal normal.  Nose: Nose normal.  Mouth/Throat: Oropharynx is clear and moist.  Eyes: Conjunctivae and EOM are normal. Pupils are equal, round, and reactive to light.  Neck: Normal range of motion and full passive range of motion without pain. Neck supple. No JVD present. Carotid bruit is not present. No thyromegaly present.  Cardiovascular: Normal rate, regular rhythm, normal heart sounds and  intact distal pulses. Exam reveals no gallop and no friction rub.  No murmur heard. Pulmonary/Chest: Effort normal and breath sounds normal. No respiratory distress. She has no wheezes. She has no rales. Right breast exhibits no inverted nipple, no mass, no nipple discharge, no skin change and no tenderness. Left breast exhibits no inverted nipple, no mass, no nipple discharge, no skin change and no tenderness. Breasts are symmetrical.  Abdominal: Soft. Bowel sounds are normal. She exhibits no distension and no mass. There is no tenderness. There is no rebound and no guarding.  Genitourinary: Uterus normal. There is no rash, tenderness, lesion or injury on the right labia. There is no rash, tenderness, lesion or injury on the left labia. Cervix exhibits no motion tenderness, no discharge and no friability. Right adnexum displays no mass, no tenderness and no fullness. Left adnexum displays no mass, no  tenderness and no fullness. No erythema, tenderness or bleeding in the vagina. No foreign body in the vagina. No signs of injury around the vagina. No vaginal discharge found.  Musculoskeletal:       Right shoulder: Normal.       Left shoulder: Normal.       Cervical back: Normal.  Lymphadenopathy:    She has no cervical adenopathy.  Neurological: She is alert and oriented to person, place, and time. She has normal reflexes. No cranial nerve deficit. She exhibits normal muscle tone. Coordination normal.  Skin: Skin is warm and dry. No rash noted. She is not diaphoretic. No erythema. No pallor.  Psychiatric: She has a normal mood and affect. Her behavior is normal. Judgment and thought content normal.  Nursing note and vitals reviewed.  No results found. Depression screen PHQ 2/9 04/26/2017  Decreased Interest 0  Down, Depressed, Hopeless 0  PHQ - 2 Score 0   Fall Risk  04/26/2017  Falls in the past year? No        Assessment & Plan:   1. Routine physical examination   2. Screening for diabetes mellitus   3. Screening, lipid   4. Colon cancer screening   5. Screening for HIV (human immunodeficiency virus)   6. Need for hepatitis C screening test   7. Cervical cancer screening     -anticipatory guidance provided --- exercise, weight loss, safe driving practices, three servings of dairy daily OR calcium 600mg  twice daily. -obtain age appropriate screening labs and labs for chronic disease management. -Refer for colonoscopy for colon cancer screening. -Status post Tdap in office.   Orders Placed This Encounter  Procedures  . Tdap vaccine greater than or equal to 7yo IM  . HIV antibody  . Hepatitis C antibody  . Ambulatory referral to Gastroenterology    Referral Priority:   Routine    Referral Type:   Consultation    Referral Reason:   Specialty Services Required    Number of Visits Requested:   1  . POCT urinalysis dipstick   No orders of the defined types were placed  in this encounter.   Return in about 1 year (around 04/26/2018) for complete physical examiniation.   Chancy Claros Paulita FujitaMartin Silverio Hagan, M.D. Primary [facility] at [facility]  [place] [facility] [address] [place], [place]  [id number] [phone] phone [phone]) [phone] fax

## 2017-04-28 ENCOUNTER — Encounter: Payer: Self-pay | Admitting: Family Medicine

## 2017-04-28 LAB — PAP IG, CT-NG NAA, HPV HIGH-RISK
Chlamydia, Nuc. Acid Amp: NEGATIVE
Gonococcus by Nucleic Acid Amp: NEGATIVE
HPV, high-risk: NEGATIVE
PAP Smear Comment: 0

## 2017-04-28 LAB — HIV ANTIBODY (ROUTINE TESTING W REFLEX): HIV Screen 4th Generation wRfx: NONREACTIVE

## 2017-04-28 LAB — HEPATITIS C ANTIBODY: Hep C Virus Ab: <0.1 s/co ratio (ref 0.0–0.9)

## 2017-05-17 DIAGNOSIS — H16423 Pannus (corneal), bilateral: Secondary | ICD-10-CM | POA: Diagnosis not present

## 2017-06-24 ENCOUNTER — Encounter: Payer: Self-pay | Admitting: Family Medicine

## 2017-08-23 ENCOUNTER — Encounter: Payer: Self-pay | Admitting: Family Medicine

## 2017-10-12 DIAGNOSIS — Z1231 Encounter for screening mammogram for malignant neoplasm of breast: Secondary | ICD-10-CM | POA: Diagnosis not present

## 2017-12-21 DIAGNOSIS — Z23 Encounter for immunization: Secondary | ICD-10-CM | POA: Diagnosis not present

## 2019-01-17 DIAGNOSIS — Z1231 Encounter for screening mammogram for malignant neoplasm of breast: Secondary | ICD-10-CM | POA: Diagnosis not present

## 2019-02-10 ENCOUNTER — Telehealth (INDEPENDENT_AMBULATORY_CARE_PROVIDER_SITE_OTHER): Payer: Self-pay | Admitting: Registered Nurse

## 2019-02-10 ENCOUNTER — Encounter: Payer: Self-pay | Admitting: Registered Nurse

## 2019-02-10 ENCOUNTER — Other Ambulatory Visit: Payer: Self-pay

## 2019-02-10 DIAGNOSIS — R928 Other abnormal and inconclusive findings on diagnostic imaging of breast: Secondary | ICD-10-CM

## 2019-02-10 NOTE — Patient Instructions (Signed)
° ° ° °  If you have lab work done today you will be contacted with your lab results within the next 2 weeks.  If you have not heard from us then please contact us. The fastest way to get your results is to register for My Chart. ° ° °IF you received an x-ray today, you will receive an invoice from [facility]. Please contact [facility] at [phone] with questions or concerns regarding your invoice.  ° °IF you received labwork today, you will receive an invoice from [facility]. Please contact [facility] at [phone] with questions or concerns regarding your invoice.  ° °Our billing staff will not be able to assist you with questions regarding bills from these companies. ° °You will be contacted with the lab results as soon as they are available. The fastest way to get your results is to activate your My Chart account. Instructions are located on the last page of this paperwork. If you have not heard from us regarding the results in 2 weeks, please contact this office. °  ° ° ° °

## 2019-02-10 NOTE — Progress Notes (Signed)
Pt states she needs an order placed for a Diagnotic mammogram. She has a regular mammogram done and was told she need a follow-up mammogram.

## 2019-02-14 ENCOUNTER — Other Ambulatory Visit: Payer: Self-pay | Admitting: Registered Nurse

## 2019-02-14 ENCOUNTER — Encounter: Payer: Self-pay | Admitting: Registered Nurse

## 2019-02-14 DIAGNOSIS — R928 Other abnormal and inconclusive findings on diagnostic imaging of breast: Secondary | ICD-10-CM

## 2019-02-14 NOTE — Progress Notes (Signed)
Replacing order for diagnostic mammography

## 2019-02-14 NOTE — Progress Notes (Signed)
Telemedicine Encounter- SOAP NOTE Established Patient  This telephone encounter was conducted with the patient's (or proxy's) verbal consent via audio telecommunications: yes  Patient was instructed to have this encounter in a suitably private space; and to only have persons present to whom they give permission to participate. In addition, patient identity was confirmed by use of name plus two identifiers (DOB and address).  I discussed the limitations, risks, security and privacy concerns of performing an evaluation and management service by telephone and the availability of in person appointments. I also discussed with the patient that there may be a patient responsible charge related to this service. The patient expressed understanding and agreed to proceed.  I spent a total of 11 minutes talking with the patient or their proxy.  No chief complaint on file.   Subjective   Kristen Wilkerson is a 58 y.o. established patient. Telephone visit today for visit to establish care  HPI At current visit, pt notes she wishes to mainly address an abnormal screening mammogram that she recently had. She is hoping to have this order placed ASAP.  Otherwise, no major concerns or complaints. It appears her history is up to date in her chart.   There are no active problems to display for this patient.   Past Medical History:  Diagnosis Date  . Allergic rhinitis, cause unspecified    Claritin, Flonase seasonally  . Exercise-induced asthma    related to weather changes/allergies; had normal stress echo for evaluation of DOE  . Palpitations     Current Outpatient Medications  Medication Sig Dispense Refill  . albuterol (PROVENTIL HFA;VENTOLIN HFA) 108 (90 BASE) MCG/ACT inhaler Inhale 2 puffs into the lungs every 6 (six) hours as needed for wheezing (use 20 minutes prior to exercise and prn). 36 g 2  . CALCIUM-MAGNESIUM-ZINC PO Take 1 tablet by mouth daily.      . cholecalciferol (VITAMIN D)  1000 UNITS tablet Take 1,000 Units by mouth daily.      Marland Kitchen loratadine (CLARITIN) 10 MG tablet Take 10 mg by mouth daily.      . Multiple Vitamin (MULTIVITAMIN) tablet Take 1 tablet by mouth daily.       No current facility-administered medications for this visit.     No Known Allergies  Social History   Socioeconomic History  . Marital status: Single    Spouse name: Not on file  . Number of children: 0  . Years of education: Not on file  . Highest education level: Not on file  Occupational History  . Occupation: Insurance account manager (product development)    Employer: [employer]  Social Needs  . Financial resource strain: Not hard at all  . Food insecurity    Worry: Never true    Inability: Never true  . Transportation needs    Medical: No    Non-medical: No  Tobacco Use  . Smoking status: Never Smoker  . Smokeless tobacco: Never Used  Substance and Sexual Activity  . Alcohol use: Yes    Alcohol/week: 4.0 standard drinks    Types: 4 Glasses of wine per week    Comment: 3-4 drinks per week.  . Drug use: No  . Sexual activity: Not Currently    Birth control/protection: Post-menopausal  Lifestyle  . Physical activity    Days per week: 5 days    Minutes per session: 30 min  . Stress: Only a little  Relationships  . Social Musician on phone:  Three times a week    Gets together: Twice a week    Attends religious service: Patient refused    Active member of club or organization: Patient refused    Attends meetings of clubs or organizations: Patient refused    Relationship status: Patient refused  . Intimate partner violence    Fear of current or ex partner: No    Emotionally abused: No    Physically abused: No    Forced sexual activity: No  Other Topics Concern  . 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      Tobacco: none      Alcohol: 3-5 alcoholic beverages per week      Exercise: hike, walking, biking, running, yoga; daily exercise; gym membership      Seatbelt: 100%      Sexual activity: in 2019, no sexual activity since 2014.      Review of Systems  Constitutional: Negative.   HENT: Negative.   Eyes: Negative.   Respiratory: Negative.   Cardiovascular: Negative.   Gastrointestinal: Negative.   Genitourinary: Negative.   Musculoskeletal: Negative.   Skin: Negative.   Neurological: Negative.   Endo/Heme/Allergies: Negative.   Psychiatric/Behavioral: Negative.   All other systems reviewed and are negative.   Objective   Vitals as reported by the patient: There were no vitals filed for this visit.  Diagnoses and all orders for this visit:  Abnormal mammogram   PLAN  Order has been placed in a separate encounter.   Encouraged pt to present for CPE and labs at her convenience  Patient encouraged to call clinic with any questions, comments, or concerns.    I discussed the assessment and treatment plan with the patient. The patient was provided an opportunity to ask questions and all were answered. The patient agreed with the plan and demonstrated an understanding of the instructions.   The patient was advised to call back or seek an in-person evaluation if the symptoms worsen or if the condition fails to improve as anticipated.  I provided 11 minutes of non-face-to-face time during this encounter.  Maximiano Coss, NP  Primary Care at [facility]

## 2019-02-20 ENCOUNTER — Other Ambulatory Visit: Payer: Self-pay | Admitting: Registered Nurse

## 2019-02-20 DIAGNOSIS — R928 Other abnormal and inconclusive findings on diagnostic imaging of breast: Secondary | ICD-10-CM

## 2019-02-28 ENCOUNTER — Ambulatory Visit
Admission: RE | Admit: 2019-02-28 | Discharge: 2019-02-28 | Disposition: A | Discharge status: Home / Self Care | Payer: BC Managed Care – PPO | Source: Ambulatory Visit | Attending: Registered Nurse | Admitting: Registered Nurse

## 2019-02-28 ENCOUNTER — Other Ambulatory Visit: Payer: Self-pay | Admitting: Registered Nurse

## 2019-02-28 ENCOUNTER — Other Ambulatory Visit: Payer: Self-pay

## 2019-02-28 ENCOUNTER — Ambulatory Visit
Admission: RE | Admit: 2019-02-28 | Discharge: 2019-02-28 | Disposition: A | Discharge status: Home / Self Care | Payer: BLUE CROSS/BLUE SHIELD | Source: Ambulatory Visit | Attending: Registered Nurse | Admitting: Registered Nurse

## 2019-02-28 DIAGNOSIS — R928 Other abnormal and inconclusive findings on diagnostic imaging of breast: Secondary | ICD-10-CM

## 2019-02-28 DIAGNOSIS — N6310 Unspecified lump in the right breast, unspecified quadrant: Secondary | ICD-10-CM

## 2019-02-28 DIAGNOSIS — N6315 Unspecified lump in the right breast, overlapping quadrants: Secondary | ICD-10-CM | POA: Diagnosis not present

## 2019-02-28 DIAGNOSIS — N6313 Unspecified lump in the right breast, lower outer quadrant: Secondary | ICD-10-CM | POA: Diagnosis not present

## 2019-02-28 DIAGNOSIS — Z20822 Contact with and (suspected) exposure to covid-19: Secondary | ICD-10-CM

## 2019-02-28 DIAGNOSIS — N6311 Unspecified lump in the right breast, upper outer quadrant: Secondary | ICD-10-CM | POA: Diagnosis not present

## 2019-02-28 DIAGNOSIS — N6041 Mammary duct ectasia of right breast: Secondary | ICD-10-CM | POA: Diagnosis not present

## 2019-03-02 LAB — NOVEL CORONAVIRUS, NAA: SARS-CoV-2, NAA: NOT DETECTED

## 2019-03-13 ENCOUNTER — Other Ambulatory Visit: Payer: Self-pay | Admitting: Registered Nurse

## 2019-03-13 DIAGNOSIS — N6310 Unspecified lump in the right breast, unspecified quadrant: Secondary | ICD-10-CM

## 2019-08-22 DIAGNOSIS — H5213 Myopia, bilateral: Secondary | ICD-10-CM | POA: Diagnosis not present

## 2019-08-30 ENCOUNTER — Other Ambulatory Visit: Payer: Self-pay | Admitting: Registered Nurse

## 2019-08-30 ENCOUNTER — Other Ambulatory Visit: Payer: Self-pay

## 2019-08-30 ENCOUNTER — Ambulatory Visit
Admission: RE | Admit: 2019-08-30 | Discharge: 2019-08-30 | Disposition: A | Discharge status: Home / Self Care | Payer: BC Managed Care – PPO | Source: Ambulatory Visit | Attending: Registered Nurse | Admitting: Registered Nurse

## 2019-08-30 DIAGNOSIS — N6310 Unspecified lump in the right breast, unspecified quadrant: Secondary | ICD-10-CM

## 2019-08-30 DIAGNOSIS — N6001 Solitary cyst of right breast: Secondary | ICD-10-CM | POA: Diagnosis not present

## 2019-08-30 DIAGNOSIS — R921 Mammographic calcification found on diagnostic imaging of breast: Secondary | ICD-10-CM | POA: Diagnosis not present

## 2020-01-16 ENCOUNTER — Telehealth: Payer: Self-pay

## 2020-01-16 ENCOUNTER — Encounter: Payer: Self-pay | Admitting: Registered Nurse

## 2020-01-16 NOTE — Telephone Encounter (Signed)
Yes, okay.  Please push out a couple of weeks.

## 2020-01-16 NOTE — Telephone Encounter (Signed)
Patient states she spoke with Delford Field and he said he spoke with you and you stated it would be okay for this patient to be a new patient. Just sending you a message to confirm or deny. Thanks

## 2020-01-16 NOTE — Telephone Encounter (Signed)
error 

## 2020-01-18 NOTE — Telephone Encounter (Signed)
   Scheduler left message [id number] to schedule new patient appointment/[place]

## 2020-02-12 NOTE — Progress Notes (Signed)
Subjective:    Patient ID: Kristen Wilkerson, female    DOB: March 14, 1961, 59 y.o.   MRN: [id number]   This visit occurred during the SARS-CoV-2 public health emergency.  Safety protocols were in place, including screening questions prior to the visit, additional usage of staff PPE, and extensive cleaning of exam room while observing appropriate contact time as indicated for disinfecting solutions.    HPI  She is here to establish with a new pcp.  She is here for a physical exam.    She thinks she has plantar fasciitis.  It started this summer.  It was both feet.  She walks 5-6 miles a day - has cut this back.  She elevated her feet, ice her feet 3/days.  Not as bad in the morning.  Worse if she sits down after walking - then stands. She did get new sneakers  Hoka.    Medications and allergies reviewed with patient and updated if appropriate.  There are no problems to display for this patient.   Current Outpatient Medications on File Prior to Visit  Medication Sig Dispense Refill  . Black Cohosh 20 MG TABS Take by mouth.    . loratadine (CLARITIN) 10 MG tablet Take 10 mg by mouth daily.      . Multiple Vitamin (MULTIVITAMIN) tablet Take 1 tablet by mouth daily.       No current facility-administered medications on file prior to visit.    Past Medical History:  Diagnosis Date  . Allergic rhinitis, cause unspecified    Claritin, Flonase seasonally  . Exercise-induced asthma    related to weather changes/allergies; had normal stress echo for evaluation of DOE  . Palpitations     Past Surgical History:  Procedure Laterality Date  . BREAST BIOPSY Right 2010  . THYROID CYST EXCISION  17  . TONSILLECTOMY  19  . uterine cyst removal      Social History   Socioeconomic History  . Marital status: Single    Spouse name: Not on file  . Number of children: 0  . Years of education: Not on file  . Highest education level: Not on file  Occupational History  . Occupation:  management (product development)    Employer: [employer]  Tobacco Use  . Smoking status: Never Smoker  . Smokeless tobacco: Never Used  Vaping Use  . Vaping Use: Never used  Substance and Sexual Activity  . Alcohol use: Yes    Alcohol/week: 4.0 standard drinks    Types: 4 Glasses of wine per week    Comment: 3-4 drinks per week.  . Drug use: No  . Sexual activity: Not Currently    Birth control/protection: Post-menopausal  Other Topics Concern  . 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      Tobacco: none      Alcohol: 3-5 alcoholic beverages per week      Exercise: hike, walking, biking, running, yoga; daily exercise; gym membership      Seatbelt: 100%      Sexual activity: in 2019, no sexual activity since 2014.     Social Determinants of Health   Financial Resource Strain:   . Difficulty of Paying Living Expenses: Not on file  Food Insecurity:   . Worried About Programme researcher, broadcasting/film/video in the Last Year: Not on file  . Ran Out of Food in the Last Year: Not on file  Transportation Needs:   . Lack of Transportation (Medical): Not on file  . Lack of Transportation (Non-Medical): Not on file  Physical Activity:   . Days of Exercise per Week: Not on file  . Minutes of Exercise per Session: Not on file  Stress:   . Feeling of Stress : Not on file  Social Connections:   . Frequency of Communication with Friends and Family: Not on file  . Frequency of Social Gatherings with Friends and Family: Not on file  . Attends Religious Services: Not on file  . Active Member of Clubs or Organizations: Not on file  . Attends Banker Meetings: Not on file  . Marital Status: Not on file    Family History  Problem Relation Age of Onset  . Hypertension Mother   . Hyperlipidemia Mother   .  Stroke Father 35  . Hypertension Father   . Heart disease Father        CHF  . Parkinsonism Father   . Hypertension Sister   . Hypertension Brother   . Hypertension Sister   . Migraines Brother   . Diabetes Maternal Uncle   . Parkinsonism Maternal Uncle   . Diabetes Maternal Grandmother   . Heart disease Maternal Grandmother   . Stroke Maternal Grandmother   . Diabetes Maternal Grandfather   . Stroke Maternal Grandfather 61  . Heart disease Paternal Grandmother     Review of Systems  Constitutional: Negative for chills and fever.  Eyes: Negative for visual disturbance.  Respiratory: Negative for cough, shortness of breath and wheezing.   Cardiovascular: Negative for chest pain, palpitations and leg swelling.  Gastrointestinal: Negative for abdominal pain, blood in stool, constipation, diarrhea and nausea.       No gerd  Genitourinary: Negative for dysuria and frequency.  Musculoskeletal: Positive for arthralgias (mild stiffness in morning). Negative for back pain.       B/l foot pain  Skin: Negative for color change and rash.  Neurological: Negative for dizziness, light-headedness, numbness and headaches.  Psychiatric/Behavioral: Negative for dysphoric mood. The patient is not nervous/anxious.        Objective:   Vitals:   02/13/20 1305  BP: 108/72  Pulse: 85  Temp: 98.1 F (36.7 C)  SpO2: 98%   Filed Weights   02/13/20 1305  Weight: 147 lb (66.7 kg)   Body mass index is 21.04 kg/m.  BP Readings from Last 3 Encounters:  02/13/20 108/72  04/26/17 116/72  12/07/16 122/72    Wt Readings from Last 3 Encounters:  02/13/20 147 lb (66.7 kg)  04/26/17 142 lb (64.4 kg)  12/07/16 135 lb (61.2 kg)     Physical Exam Constitutional: She appears well-developed and well-nourished. No distress.  HENT:  Head: Normocephalic and atraumatic.  Right Ear: External ear normal. Normal ear canal and TM Left Ear: External ear normal.  Normal ear canal and TM Mouth/Throat:  Oropharynx is clear and moist.  Eyes: Conjunctivae and EOM are normal.  Neck: Neck supple. No tracheal deviation present. No thyromegaly present.  No carotid bruit  Cardiovascular: Normal rate, regular rhythm and normal heart sounds.   No murmur heard.  No edema. Pulmonary/Chest: Effort normal and breath sounds normal. No respiratory distress. She has no wheezes. She has no rales.  Breast: deferred  Abdominal: Soft. She exhibits no distension. There is no tenderness.  Lymphadenopathy: She has no cervical adenopathy.  MSk:  Mild tenderness left heel on lateral and medial aspect - no achilles tenderness Skin: Skin is warm and dry. She is not diaphoretic.  Psychiatric: She has a normal mood and affect. Her behavior is normal.        Assessment & Plan:   Physical exam: Screening blood work    ordered Immunizations  shingrix #1 today. Colonoscopy  Never had one Mammogram  Scheduled for next month Gyn  N/a - 2018 - no problems in past   Eye exams  Up to date  Exercise  Walking, yoga, runs a little Weight  normal Substance abuse  none      See Problem List for Assessment and Plan of chronic medical problems.

## 2020-02-13 ENCOUNTER — Encounter: Payer: Self-pay | Admitting: Internal Medicine

## 2020-02-13 ENCOUNTER — Other Ambulatory Visit: Payer: Self-pay

## 2020-02-13 ENCOUNTER — Ambulatory Visit (INDEPENDENT_AMBULATORY_CARE_PROVIDER_SITE_OTHER): Payer: BC Managed Care – PPO | Admitting: Internal Medicine

## 2020-02-13 VITALS — BP 108/72 | HR 85 | Temp 98.1°F | Ht 70.08 in | Wt 147.0 lb

## 2020-02-13 DIAGNOSIS — M722 Plantar fascial fibromatosis: Secondary | ICD-10-CM | POA: Insufficient documentation

## 2020-02-13 DIAGNOSIS — J309 Allergic rhinitis, unspecified: Secondary | ICD-10-CM | POA: Insufficient documentation

## 2020-02-13 DIAGNOSIS — J302 Other seasonal allergic rhinitis: Secondary | ICD-10-CM | POA: Diagnosis not present

## 2020-02-13 DIAGNOSIS — Z0000 Encounter for general adult medical examination without abnormal findings: Secondary | ICD-10-CM | POA: Diagnosis not present

## 2020-02-13 DIAGNOSIS — Z23 Encounter for immunization: Secondary | ICD-10-CM | POA: Diagnosis not present

## 2020-02-13 DIAGNOSIS — Z1211 Encounter for screening for malignant neoplasm of colon: Secondary | ICD-10-CM | POA: Diagnosis not present

## 2020-02-13 NOTE — Assessment & Plan Note (Signed)
Acute Started over the summer Continue ice  Stretch - hand out given Advised nsaid - advil twice daily Avoid barefoot, continue wearing hokas If no improvement consider sports med, ortho or podiatry referral

## 2020-02-13 NOTE — Patient Instructions (Addendum)
Blood work was ordered.    Shingles vaccine given today.  All other Health Maintenance issues reviewed.   All recommended immunizations and age-appropriate screenings are up-to-date or discussed.  No immunization administered today.   Medications reviewed and updated.  Changes include :   none   Please followup in 1 year   Plantar Fasciitis Rehab Ask your health care provider which exercises are safe for you. Do exercises exactly as told by your health care provider and adjust them as directed. It is normal to feel mild stretching, pulling, tightness, or discomfort as you do these exercises. Stop right away if you feel sudden pain or your pain gets worse. Do not begin these exercises until told by your health care provider. Stretching and range-of-motion exercises These exercises warm up your muscles and joints and improve the movement and flexibility of your foot. These exercises also help to relieve pain. Plantar fascia stretch  1. Sit with your left / right leg crossed over your opposite knee. 2. Hold your heel with one hand with that thumb near your arch. With your other hand, hold your toes and gently pull them back toward the top of your foot. You should feel a stretch on the bottom of your toes or your foot (plantar fascia) or both. 3. Hold this stretch for__________ seconds. 4. Slowly release your toes and return to the starting position. Repeat __________ times. Complete this exercise __________ times a day. Gastrocnemius stretch, standing This exercise is also called a calf (gastroc) stretch. It stretches the muscles in the back of the upper calf. 1. Stand with your hands against a wall. 2. Extend your left / right leg behind you, and bend your front knee slightly. 3. Keeping your heels on the floor and your back knee straight, shift your weight toward the wall. Do not arch your back. You should feel a gentle stretch in your upper left / right calf. 4. Hold this position for  __________ seconds. Repeat __________ times. Complete this exercise __________ times a day. Soleus stretch, standing This exercise is also called a calf (soleus) stretch. It stretches the muscles in the back of the lower calf. 1. Stand with your hands against a wall. 2. Extend your left / right leg behind you, and bend your front knee slightly. 3. Keeping your heels on the floor, bend your back knee and shift your weight slightly over your back leg. You should feel a gentle stretch deep in your lower calf. 4. Hold this position for __________ seconds. Repeat __________ times. Complete this exercise __________ times a day. Gastroc and soleus stretch, standing step This exercise stretches the muscles in the back of the lower leg. These muscles are in the upper calf (gastrocnemius) and the lower calf (soleus). 1. Stand with the ball of your left / right foot on a step. The ball of your foot is on the walking surface, right under your toes. 2. Keep your other foot firmly on the same step. 3. Hold on to the wall or a railing for balance. 4. Slowly lift your other foot, allowing your body weight to press your left / right heel down over the edge of the step. You should feel a stretch in your left / right calf. 5. Hold this position for __________ seconds. 6. Return both feet to the step. 7. Repeat this exercise with a slight bend in your left / right knee. Repeat __________ times with your left / right knee straight and __________ times with your  left / right knee bent. Complete this exercise __________ times a day. Balance exercise This exercise builds your balance and strength control of your arch to help take pressure off your plantar fascia. Single leg stand If this exercise is too easy, you can try it with your eyes closed or while standing on a pillow. 1. Without shoes, stand near a railing or in a doorway. You may hold on to the railing or door frame as needed. 2. Stand on your left / right  foot. Keep your big toe down on the floor and try to keep your arch lifted. Do not let your foot roll inward. 3. Hold this position for __________ seconds. Repeat __________ times. Complete this exercise __________ times a day. This information is not intended to replace advice given to you by your health care provider. Make sure you discuss any questions you have with your health care provider. Document Revised: 07/07/2018 Document Reviewed: 01/12/2018 Elsevier Patient Education  2020 [employer].    Health Maintenance, Female Adopting a healthy lifestyle and getting preventive care are important in promoting health and wellness. Ask your health care provider about:  The right schedule for you to have regular tests and exams.  Things you can do on your own to prevent diseases and keep yourself healthy. What should I know about diet, weight, and exercise? Eat a healthy diet   Eat a diet that includes plenty of vegetables, fruits, low-fat dairy products, and lean protein.  Do not eat a lot of foods that are high in solid fats, added sugars, or sodium. Maintain a healthy weight Body mass index (BMI) is used to identify weight problems. It estimates body fat based on height and weight. Your health care provider can help determine your BMI and help you achieve or maintain a healthy weight. Get regular exercise Get regular exercise. This is one of the most important things you can do for your health. Most adults should:  Exercise for at least 150 minutes each week. The exercise should increase your heart rate and make you sweat (moderate-intensity exercise).  Do strengthening exercises at least twice a week. This is in addition to the moderate-intensity exercise.  Spend less time sitting. Even light physical activity can be beneficial. Watch cholesterol and blood lipids Have your blood tested for lipids and cholesterol at 58 years of age, then have this test every 5 years. Have your  cholesterol levels checked more often if:  Your lipid or cholesterol levels are high.  You are older than 58 years of age.  You are at high risk for heart disease. What should I know about cancer screening? Depending on your health history and family history, you may need to have cancer screening at various ages. This may include screening for:  Breast cancer.  Cervical cancer.  Colorectal cancer.  Skin cancer.  Lung cancer. What should I know about heart disease, diabetes, and high blood pressure? Blood pressure and heart disease  High blood pressure causes heart disease and increases the risk of stroke. This is more likely to develop in people who have high blood pressure readings, are of African descent, or are overweight.  Have your blood pressure checked: ? Every 3-5 years if you are 24-103 years of age. ? Every year if you are 35 years old or older. Diabetes Have regular diabetes screenings. This checks your fasting blood sugar level. Have the screening done:  Once every three years after age 67 if you are at a normal  weight and have a low risk for diabetes.  More often and at a younger age if you are overweight or have a high risk for diabetes. What should I know about preventing infection? Hepatitis B If you have a higher risk for hepatitis B, you should be screened for this virus. Talk with your health care provider to find out if you are at risk for hepatitis B infection. Hepatitis C Testing is recommended for:  Everyone born from [birth year] through 1965.  Anyone with known risk factors for hepatitis C. Sexually transmitted infections (STIs)  Get screened for STIs, including gonorrhea and chlamydia, if: ? You are sexually active and are younger than 58 years of age. ? You are older than 58 years of age and your health care provider tells you that you are at risk for this type of infection. ? Your sexual activity has changed since you were last screened, and you are  at increased risk for chlamydia or gonorrhea. Ask your health care provider if you are at risk.  Ask your health care provider about whether you are at high risk for HIV. Your health care provider may recommend a prescription medicine to help prevent HIV infection. If you choose to take medicine to prevent HIV, you should first get tested for HIV. You should then be tested every 3 months for as long as you are taking the medicine. Pregnancy  If you are about to stop having your period (premenopausal) and you may become pregnant, seek counseling before you get pregnant.  Take 400 to 800 micrograms (mcg) of folic acid every day if you become pregnant.  Ask for birth control (contraception) if you want to prevent pregnancy. Osteoporosis and menopause Osteoporosis is a disease in which the bones lose minerals and strength with aging. This can result in bone fractures. If you are 58 years old or older, or if you are at risk for osteoporosis and fractures, ask your health care provider if you should:  Be screened for bone loss.  Take a calcium or vitamin D supplement to lower your risk of fractures.  Be given hormone replacement therapy (HRT) to treat symptoms of menopause. Follow these instructions at home: Lifestyle  Do not use any products that contain nicotine or tobacco, such as cigarettes, e-cigarettes, and chewing tobacco. If you need help quitting, ask your health care provider.  Do not use street drugs.  Do not share needles.  Ask your health care provider for help if you need support or information about quitting drugs. Alcohol use  Do not drink alcohol if: ? Your health care provider tells you not to drink. ? You are pregnant, may be pregnant, or are planning to become pregnant.  If you drink alcohol: ? Limit how much you use to 0-1 drink a day. ? Limit intake if you are breastfeeding.  Be aware of how much alcohol is in your drink. In the [place] one drink equals one 12 oz  bottle of beer (355 mL), one 5 oz glass of wine (148 mL), or one 1 oz glass of hard liquor (44 mL). General instructions  Schedule regular health, dental, and eye exams.  Stay current with your vaccines.  Tell your health care provider if: ? You often feel depressed. ? You have ever been abused or do not feel safe at home. Summary  Adopting a healthy lifestyle and getting preventive care are important in promoting health and wellness.  Follow your health care provider's instructions about healthy diet, exercising,  and getting tested or screened for diseases.  Follow your health care provider's instructions on monitoring your cholesterol and blood pressure. This information is not intended to replace advice given to you by your health care provider. Make sure you discuss any questions you have with your health care provider. Document Revised: 03/09/2018 Document Reviewed: 03/09/2018 Elsevier Patient Education  2020 [employer].

## 2020-02-13 NOTE — Assessment & Plan Note (Signed)
Chronic Mild Controlled with claritin

## 2020-02-14 ENCOUNTER — Other Ambulatory Visit (INDEPENDENT_AMBULATORY_CARE_PROVIDER_SITE_OTHER): Payer: BC Managed Care – PPO

## 2020-02-14 DIAGNOSIS — Z0000 Encounter for general adult medical examination without abnormal findings: Secondary | ICD-10-CM

## 2020-02-14 LAB — CBC WITH DIFFERENTIAL/PLATELET
Basophils Absolute: 0 10*3/uL (ref 0.0–0.1)
Basophils Relative: 0.5 % (ref 0.0–3.0)
Eosinophils Absolute: 0.3 10*3/uL (ref 0.0–0.7)
Eosinophils Relative: 4.3 % (ref 0.0–5.0)
HCT: 41.8 % (ref 36.0–46.0)
Hemoglobin: 14.3 g/dL (ref 12.0–15.0)
Lymphocytes Relative: 26.2 % (ref 12.0–46.0)
Lymphs Abs: 2 10*3/uL (ref 0.7–4.0)
MCHC: 34.2 g/dL (ref 30.0–36.0)
MCV: 89.7 fl (ref 78.0–100.0)
Monocytes Absolute: 0.7 10*3/uL (ref 0.1–1.0)
Monocytes Relative: 8.5 % (ref 3.0–12.0)
Neutro Abs: 4.7 10*3/uL (ref 1.4–7.7)
Neutrophils Relative %: 60.5 % (ref 43.0–77.0)
Platelets: 239 10*3/uL (ref 150.0–400.0)
RBC: 4.66 Mil/uL (ref 3.87–5.11)
RDW: 13 % (ref 11.5–15.5)
WBC: 7.7 10*3/uL (ref 4.0–10.5)

## 2020-02-14 LAB — LIPID PANEL
Cholesterol: 176 mg/dL (ref 0–200)
HDL: 63.7 mg/dL (ref >39.00)
LDL Cholesterol: 93 mg/dL (ref 0–99)
NonHDL: 112.03
Total CHOL/HDL Ratio: 3
Triglycerides: 93 mg/dL (ref 0.0–149.0)
VLDL: 18.6 mg/dL (ref 0.0–40.0)

## 2020-02-14 LAB — COMPREHENSIVE METABOLIC PANEL
ALT: 11 U/L (ref 0–35)
AST: 15 U/L (ref 0–37)
Albumin: 4.2 g/dL (ref 3.5–5.2)
Alkaline Phosphatase: 83 U/L (ref 39–117)
BUN: 14 mg/dL (ref 6–23)
CO2: 26 mEq/L (ref 19–32)
Calcium: 9.4 mg/dL (ref 8.4–10.5)
Chloride: 105 mEq/L (ref 96–112)
Creatinine, Ser: 0.62 mg/dL (ref 0.40–1.20)
GFR: 98.06 mL/min (ref >60.00)
Glucose, Bld: 86 mg/dL (ref 70–99)
Potassium: 4.3 mEq/L (ref 3.5–5.1)
Sodium: 139 mEq/L (ref 135–145)
Total Bilirubin: 1.1 mg/dL (ref 0.2–1.2)
Total Protein: 6.7 g/dL (ref 6.0–8.3)

## 2020-02-14 LAB — TSH: TSH: 2.56 u[IU]/mL (ref 0.35–4.50)

## 2020-03-04 ENCOUNTER — Ambulatory Visit
Admission: RE | Admit: 2020-03-04 | Discharge: 2020-03-04 | Disposition: A | Discharge status: Home / Self Care | Payer: BC Managed Care – PPO | Source: Ambulatory Visit | Attending: Registered Nurse | Admitting: Registered Nurse

## 2020-03-04 ENCOUNTER — Other Ambulatory Visit: Payer: Self-pay

## 2020-03-04 DIAGNOSIS — N6310 Unspecified lump in the right breast, unspecified quadrant: Secondary | ICD-10-CM

## 2020-03-04 DIAGNOSIS — N6311 Unspecified lump in the right breast, upper outer quadrant: Secondary | ICD-10-CM | POA: Diagnosis not present

## 2020-03-04 DIAGNOSIS — R921 Mammographic calcification found on diagnostic imaging of breast: Secondary | ICD-10-CM | POA: Diagnosis not present

## 2020-07-03 ENCOUNTER — Other Ambulatory Visit: Payer: Self-pay

## 2020-07-03 ENCOUNTER — Ambulatory Visit (INDEPENDENT_AMBULATORY_CARE_PROVIDER_SITE_OTHER): Payer: Self-pay

## 2020-07-03 DIAGNOSIS — Z23 Encounter for immunization: Secondary | ICD-10-CM

## 2020-07-03 NOTE — Progress Notes (Signed)
Pt here for 2nd shingrix njection per Burns.  Shingrix in left arm, given IM, and pt tolerated injection well.

## 2020-10-31 ENCOUNTER — Encounter: Payer: Self-pay | Admitting: Family Medicine

## 2020-10-31 ENCOUNTER — Other Ambulatory Visit: Payer: Self-pay

## 2020-10-31 ENCOUNTER — Ambulatory Visit: Payer: Self-pay

## 2020-10-31 ENCOUNTER — Ambulatory Visit (INDEPENDENT_AMBULATORY_CARE_PROVIDER_SITE_OTHER): Payer: BC Managed Care – PPO | Admitting: Family Medicine

## 2020-10-31 ENCOUNTER — Ambulatory Visit (INDEPENDENT_AMBULATORY_CARE_PROVIDER_SITE_OTHER): Payer: Self-pay

## 2020-10-31 VITALS — BP 110/72 | HR 73 | Ht 70.8 in | Wt 150.0 lb

## 2020-10-31 DIAGNOSIS — M25571 Pain in right ankle and joints of right foot: Secondary | ICD-10-CM

## 2020-10-31 DIAGNOSIS — M79672 Pain in left foot: Secondary | ICD-10-CM

## 2020-10-31 DIAGNOSIS — M25572 Pain in left ankle and joints of left foot: Secondary | ICD-10-CM

## 2020-10-31 DIAGNOSIS — M722 Plantar fascial fibromatosis: Secondary | ICD-10-CM

## 2020-10-31 MED ORDER — NITROGLYCERIN 0.2 MG/HR TD PT24: 0.2000 mg | MEDICATED_PATCH | Freq: Every day | TRANSDERMAL | 0 refills | Status: DC

## 2020-10-31 NOTE — Patient Instructions (Addendum)
Good to see you  Xray's on the way out Exercises given  Newton shoes Oofos or Hoka recovery sandals Nitroglycerine patch sent to pharmacy   Nitroglycerin Protocol   Apply 1/4 nitroglycerin patch to affected area daily.  Change position of patch within the affected area every 24 hours.  You may experience a headache during the first 1-2 weeks of using the patch, these should subside.  If you experience headaches after beginning nitroglycerin patch treatment, you may take your preferred over the counter pain reliever.  Another side effect of the nitroglycerin patch is skin irritation or rash related to patch adhesive.  Please notify our office if you develop more severe headaches or rash, and stop the patch.  Tendon healing with nitroglycerin patch may require 12 to 24 weeks depending on the extent of injury.  Men should not use if taking Viagra, Cialis, or Levitra.   Do not use if you have migraines or rosacea.   See me again in  6 weeks

## 2020-10-31 NOTE — Progress Notes (Signed)
[facility] [address] [place] [address] Phone: [phone] Subjective:   Kristen Wilkerson, am serving as a scribe for Dr. Antoine Primas.  I'm seeing this patient by the request  of:  [name], MD  CC: bilateral foot pain   [id number]  Kristen Wilkerson is a 60 y.o. female coming in with complaint of bilateral foot pain. Patient states that the pain has been going on for over a year. States started in the heel and now radiating to the arch of her Left foot pain is just in the heel of her R foot. Patient thought it was plantar Fasciitis and was doing things to help for that. Pain has been persisting and wants to know what she really has and how to help it.    Reviewing patient's previous chart include last time she discussed this with primary care and was given a handout of stretches, was to continue icing, discussed anti-inflammatories and avoid being barefoot.    Past Medical History:  Diagnosis Date   Allergic rhinitis, cause unspecified    Claritin, Flonase seasonally   Exercise-induced asthma    related to weather changes/allergies; had normal stress echo for evaluation of DOE   Palpitations    Past Surgical History:  Procedure Laterality Date   BREAST BIOPSY Right 2010   THYROID CYST EXCISION  18   TONSILLECTOMY  19   uterine cyst removal     Social History   Socioeconomic History   Marital status: Single    Spouse name: Not on file   Number of children: 0   Years of education: Not on file   Highest education level: Not on file  Occupational History   Occupation: management (product development)    Employer: [employer]  Tobacco Use   Smoking status: Never   Smokeless tobacco: Never  Vaping Use   Vaping Use: Never used  Substance and Sexual Activity   Alcohol use: Yes    Alcohol/week: 4.0 standard drinks    Types: 4 Glasses of wine per week    Comment: 3-4 drinks per week.   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      Tobacco: none      Alcohol: 3-5 alcoholic beverages per week      Exercise: hike, walking, biking, running, yoga; daily exercise; gym membership      Seatbelt: 100%      Sexual activity: in 2019, no sexual activity since 2014.     Social Determinants of Health   Financial Resource Strain: Not on file  Food Insecurity: Not on file  Transportation Needs: Not on file  Physical Activity: Not on file  Stress: Not on file  Social Connections: Not on file   No Known Allergies Family History  Problem Relation Age of Onset   Hypertension Mother    Hyperlipidemia Mother    Stroke Father 21   Hypertension Father    Heart disease Father        CHF   Parkinsonism Father    Hypertension Sister    Hypertension Brother  Hypertension Sister    Migraines Brother    Diabetes Maternal Uncle    Parkinsonism Maternal Uncle    Diabetes Maternal Grandmother    Heart disease Maternal Grandmother    Stroke Maternal Grandmother    Diabetes Maternal Grandfather    Stroke Maternal Grandfather 65   Heart disease Paternal Grandmother      Current Outpatient Medications (Cardiovascular):    nitroGLYCERIN (NITRO-DUR) 0.2 mg/hr patch, Place 1 patch (0.2 mg total) onto the skin daily. Apply quarter (1/4) of a patch daily to skin.  Current Outpatient Medications (Respiratory):    loratadine (CLARITIN) 10 MG tablet, Take 10 mg by mouth daily.      Current Outpatient Medications (Other):    Multiple Vitamin (MULTIVITAMIN) tablet, Take 1 tablet by mouth daily.     Reviewed prior external information including notes and imaging from  primary care provider As well as notes that were  available from care everywhere and other healthcare systems.  Past medical history, social, surgical and family history all reviewed in electronic medical record.  No pertanent information unless stated regarding to the chief complaint.   Review of Systems:  No headache, visual changes, nausea, vomiting, diarrhea, constipation, dizziness, abdominal pain, skin rash, fevers, chills, night sweats, weight loss, swollen lymph nodes, body aches, joint swelling, chest pain, shortness of breath, mood changes. POSITIVE muscle aches  Objective  Blood pressure 110/72, pulse 73, height 5' 10.8" (1.798 m), weight 150 lb (68 kg), last menstrual period 12/11/2010, SpO2 98 %.   General: No apparent distress alert and oriented x3 mood and affect normal, dressed appropriately.  HEENT: Pupils equal, extraocular movements intact  Respiratory: Patient's speak in full sentences and does not appear short of breath  Cardiovascular: No lower extremity edema, non tender, no erythema  Gait normal with good balance and coordination.  MSK:   Foot exam shows shows narrow foot mild loss of longitudinal arch.  Patient does have some tenderness noted over the plantar fascia left greater than right.  No significant swelling noted.  Neurovascular intact distally.  Limited muscular skeletal ultrasound was performed and interpreted by Antoine Primas, M  Limited musculoskeletal ultrasound of patient's left foot shows there is likely a tear, partial interstitial tear noted near the origin on the calcaneal area.  No cortical irregularity  Impression: plantar fascitis tear on left side     Impression and Recommendations:     The above documentation has been reviewed and is accurate and complete Judi Saa, DO

## 2020-11-01 ENCOUNTER — Encounter: Payer: Self-pay | Admitting: Family Medicine

## 2020-11-01 ENCOUNTER — Other Ambulatory Visit: Payer: Self-pay

## 2020-11-01 MED ORDER — PREDNISONE 20 MG PO TABS: 40.0000 mg | ORAL_TABLET | Freq: Every day | ORAL | 0 refills | Status: DC

## 2020-11-01 NOTE — Assessment & Plan Note (Signed)
Plantar fascia tear left side Start nitro warned of side effects. Discussed Icing, OTC orthotics, recovery sandals.  X-rays are pending.  Follow-up again in 6 weeks.  Worsening pain can consider the possibility of PRP or even formal physical therapy.  Patient though is very determined to get better.

## 2020-12-11 NOTE — Progress Notes (Signed)
[name] [address] [place] [address] Phone: [phone] Subjective:   Kristen Wilkerson, am serving as a scribe for Dr. Antoine Primas. This visit occurred during the SARS-CoV-2 public health emergency.  Safety protocols were in place, including screening questions prior to the visit, additional usage of staff PPE, and extensive cleaning of exam room while observing appropriate contact time as indicated for disinfecting solutions.   I'm seeing this patient by the request  of:  [name], MD  CC: Bilateral foot pain follow-up  [id number]  10/31/2020 Plantar fascia tear left side Start nitro warned of side effects. Discussed Icing, OTC orthotics, recovery sandals.  X-rays are pending.  Follow-up again in 6 weeks.  Worsening pain can consider the possibility of PRP or even formal physical therapy.  Patient though is very determined to get better.  Update 12/12/2020 Kristen Wilkerson is a 60 y.o. female coming in with complaint of B foot pain. Patient states that she is getting progressively better, but the right is doing much better than the left and it took about 4 weeks before she saw any change.  Patient states recently has made significant improvement.  Feels the new shoes have been helpful.  Doing the exercises and the nitroglycerin.  Would state that overall from 60 to 70% better.       Past Medical History:  Diagnosis Date   Allergic rhinitis, cause unspecified    Claritin, Flonase seasonally   Exercise-induced asthma    related to weather changes/allergies; had normal stress echo for evaluation of DOE   Palpitations    Past Surgical History:  Procedure Laterality Date   BREAST BIOPSY Right 2010   THYROID CYST EXCISION  18   TONSILLECTOMY  19   uterine cyst removal     Social History   Socioeconomic History   Marital status: Single    Spouse name: Not on file   Number of children: 0   Years of education: Not on file    Highest education level: Not on file  Occupational History   Occupation: management (product development)    Employer: [employer]  Tobacco Use   Smoking status: Never   Smokeless tobacco: Never  Vaping Use   Vaping Use: Never used  Substance and Sexual Activity   Alcohol use: Yes    Alcohol/week: 4.0 standard drinks    Types: 4 Glasses of wine per week    Comment: 3-4 drinks per week.   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      Tobacco: none      Alcohol: 3-5 alcoholic beverages per week      Exercise: hike, walking, biking, running, yoga; daily exercise; gym membership      Seatbelt: 100%      Sexual activity: in 2019, no sexual activity since 2014.     Social Determinants of Health   Financial Resource Strain: Not on file  Food Insecurity: Not on file  Transportation Needs: Not on file  Physical Activity: Not on file  Stress: Not on file  Social Connections: Not on file  No Known Allergies Family History  Problem Relation Age of Onset   Hypertension Mother    Hyperlipidemia Mother    Stroke Father 1   Hypertension Father    Heart disease Father        CHF   Parkinsonism Father    Hypertension Sister    Hypertension Brother    Hypertension Sister    Migraines Brother    Diabetes Maternal Uncle    Parkinsonism Maternal Uncle    Diabetes Maternal Grandmother    Heart disease Maternal Grandmother    Stroke Maternal Grandmother    Diabetes Maternal Grandfather    Stroke Maternal Grandfather 34   Heart disease Paternal Grandmother     Current Outpatient Medications (Endocrine & Metabolic):    predniSONE (DELTASONE) 20 MG tablet, Take 2 tablets (40 mg  total) by mouth daily with breakfast.  Current Outpatient Medications (Cardiovascular):    nitroGLYCERIN (NITRO-DUR) 0.2 mg/hr patch, Place 1 patch (0.2 mg total) onto the skin daily. Apply quarter (1/4) of a patch daily to skin.  Current Outpatient Medications (Respiratory):    loratadine (CLARITIN) 10 MG tablet, Take 10 mg by mouth daily.      Current Outpatient Medications (Other):    Multiple Vitamin (MULTIVITAMIN) tablet, Take 1 tablet by mouth daily.     Reviewed prior external information including notes and imaging from  primary care provider As well as notes that were available from care everywhere and other healthcare systems.  Past medical history, social, surgical and family history all reviewed in electronic medical record.  No pertanent information unless stated regarding to the chief complaint.   Review of Systems:  No headache, visual changes, nausea, vomiting, diarrhea, constipation, dizziness, abdominal pain, skin rash, fevers, chills, night sweats, weight loss, swollen lymph nodes, body aches, joint swelling, chest pain, shortness of breath, mood changes. POSITIVE muscle aches  Objective  Blood pressure 128/80, pulse 71, height 5\' 10"  (1.778 m), weight 151 lb (68.5 kg), last menstrual period 12/11/2010, SpO2 98 %.   General: No apparent distress alert and oriented x3 mood and affect normal, dressed appropriately.  HEENT: Pupils equal, extraocular movements intact  Respiratory: Patient's speak in full sentences and does not appear short of breath  Cardiovascular: No lower extremity edema, non tender, no erythema  Gait normal with good balance and coordination.  MSK: Foot exam shows the patient does still have some mild tenderness noted left greater than right over the medial calcaneal area and on the plantar aspect of the left foot.  Patient has full range of motion otherwise.  No significant tenderness in the Achilles region.  Very mild tightness of the posterior  capsule on the left greater than the right.  Limited muscular skeletal ultrasound was performed and interpreted by 12/13/2010, M  Limited ultrasound shows significant decrease in hypoechoic changes in diameter of the right plantar fascia.  Left plantar flap showed does have significant still increase in size but patient is tearing noted previously seems to have decreased in size with scar tissue formation noted. Impression: Improvement interval noted.    Impression and Recommendations:     The above documentation has been reviewed and is accurate and complete Antoine Primas, DO

## 2020-12-12 ENCOUNTER — Ambulatory Visit: Payer: Self-pay

## 2020-12-12 ENCOUNTER — Other Ambulatory Visit: Payer: Self-pay

## 2020-12-12 ENCOUNTER — Ambulatory Visit (INDEPENDENT_AMBULATORY_CARE_PROVIDER_SITE_OTHER): Payer: BC Managed Care – PPO | Admitting: Family Medicine

## 2020-12-12 ENCOUNTER — Encounter: Payer: Self-pay | Admitting: Family Medicine

## 2020-12-12 VITALS — BP 128/80 | HR 71 | Ht 70.0 in | Wt 151.0 lb

## 2020-12-12 DIAGNOSIS — M722 Plantar fascial fibromatosis: Secondary | ICD-10-CM | POA: Diagnosis not present

## 2020-12-12 NOTE — Patient Instructions (Signed)
Good to see you! Making great improvements  Referral for orthotics May walk up to 3 miles 3x a week (make sure you ice) See you again in 6 weeks

## 2020-12-12 NOTE — Assessment & Plan Note (Signed)
Patient continues to have some mild changes that seems to be consistent with a partial tear of the plantar fascia.  Continue the nitroglycerin.  Patient is going to start increasing activity.  Goal of patient is to be able to hike and run again which hopefully will be able to do.  Patient does have moderate heel spurs noted bilaterally that we will need to continue to monitor.  Follow-up again 4 to 8 weeks.

## 2020-12-23 ENCOUNTER — Ambulatory Visit (INDEPENDENT_AMBULATORY_CARE_PROVIDER_SITE_OTHER): Payer: BC Managed Care – PPO | Admitting: Family Medicine

## 2020-12-23 VITALS — Ht 69.0 in | Wt 145.0 lb

## 2020-12-23 DIAGNOSIS — M722 Plantar fascial fibromatosis: Secondary | ICD-10-CM | POA: Diagnosis not present

## 2020-12-23 NOTE — Progress Notes (Signed)
PCP: [name], MD  Subjective:   HPI: Patient is a 60 y.o. female here for custom orthotics.  Patient referred from Dr. Katrinka Blazing for custom orthotics. Being treated for plantar fasciitis and notes she's improving slowly. Has had symptoms for over a year.  Past Medical History:  Diagnosis Date   Allergic rhinitis, cause unspecified    Claritin, Flonase seasonally   Exercise-induced asthma    related to weather changes/allergies; had normal stress echo for evaluation of DOE   Palpitations     Current Outpatient Medications on File Prior to Visit  Medication Sig Dispense Refill   loratadine (CLARITIN) 10 MG tablet Take 10 mg by mouth daily.       Multiple Vitamin (MULTIVITAMIN) tablet Take 1 tablet by mouth daily.       nitroGLYCERIN (NITRO-DUR) 0.2 mg/hr patch Place 1 patch (0.2 mg total) onto the skin daily. Apply quarter (1/4) of a patch daily to skin. 30 patch 0   predniSONE (DELTASONE) 20 MG tablet Take 2 tablets (40 mg total) by mouth daily with breakfast. 10 tablet 0   No current facility-administered medications on file prior to visit.    Past Surgical History:  Procedure Laterality Date   BREAST BIOPSY Right 2010   THYROID CYST EXCISION  18   TONSILLECTOMY  19   uterine cyst removal      No Known Allergies  Ht 5\' 9"  (1.753 m)   Wt 145 lb (65.8 kg)   LMP 12/11/2010   BMI 21.41 kg/m   [facility] 12/23/2020  Frequency of aerobic exercise (# of days/week) 7  Average time in minutes 30  Frequency of strengthening activities (# of days/week) 3    No flowsheet data found.      Objective:  Physical Exam:  Gen: NAD, comfortable in exam room  Bilateral feet/ankles: Mod pronation.  Narrow forefoot.  No hallux valgus or rigidus.  No other gross deformity, swelling, ecchymoses FROM ankle TTP medial plantar calcaneus at plantar fascia insertion. Negative calcaneal squeeze. NV intact distally.   Assessment & Plan:  1. Plantar  fasciitis - follow up with Dr. 12/25/2020.  Custom orthotics made today and comfortable to patient.  Patient was fitted for a : standard, cushioned, semi-rigid orthotic. The orthotic was heated and afterward the patient stood on the orthotic blank positioned on the orthotic stand. The patient was positioned in subtalar neutral position and 10 degrees of ankle dorsiflexion in a weight bearing stance. After completion of molding, a stable base was applied to the orthotic blank. The blank was ground to a stable position for weight bearing. Size: 8 Base: blue med density eva Posting: none Additional orthotic padding: none

## 2021-01-21 NOTE — Progress Notes (Signed)
[name] [address] [place] [address] Phone: [phone] Subjective:   INadine Counts, am serving as a scribe for Dr. Antoine Primas. This visit occurred during the SARS-CoV-2 public health emergency.  Safety protocols were in place, including screening questions prior to the visit, additional usage of staff PPE, and extensive cleaning of exam room while observing appropriate contact time as indicated for disinfecting solutions.   I'm seeing this patient by the request  of:  [name], MD  CC: left foot pain   [id number]  12/12/2020 Patient continues to have some mild changes that seems to be consistent with a partial tear of the plantar fascia.  Continue the nitroglycerin.  Patient is going to start increasing activity.  Goal of patient is to be able to hike and run again which hopefully will be able to do.  Patient does have moderate heel spurs noted bilaterally that we will need to continue to monitor.  Follow-up again 4 to 8 weeks.  Update 01/23/2021 Kristen Wilkerson is a 60 y.o. female coming in with complaint of plantar fasciitis. Custom orthotics made on 12/23/2020. Patient states right foot is much better. Left foot has the same pain. After walking and at night is when it is the worse. Has been wearing orthotics, but unsure if its making a difference.       Past Medical History:  Diagnosis Date   Allergic rhinitis, cause unspecified    Claritin, Flonase seasonally   Exercise-induced asthma    related to weather changes/allergies; had normal stress echo for evaluation of DOE   Palpitations    Past Surgical History:  Procedure Laterality Date   BREAST BIOPSY Right 2010   THYROID CYST EXCISION  18   TONSILLECTOMY  19   uterine cyst removal     Social History   Socioeconomic History   Marital status: Single    Spouse name: Not on file   Number of children: 0   Years of education: Not on file   Highest education level:  Not on file  Occupational History   Occupation: management (product development)    Employer: [employer]  Tobacco Use   Smoking status: Never   Smokeless tobacco: Never  Vaping Use   Vaping Use: Never used  Substance and Sexual Activity   Alcohol use: Yes    Alcohol/week: 4.0 standard drinks    Types: 4 Glasses of wine per week    Comment: 3-4 drinks per week.   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      Tobacco: none      Alcohol: 3-5 alcoholic beverages per week      Exercise: hike, walking, biking, running, yoga; daily exercise; gym membership      Seatbelt: 100%      Sexual activity: in 2019, no sexual activity since 2014.     Social Determinants of Health   Financial Resource Strain: Not on file  Food Insecurity: Not on file  Transportation Needs: Not on file  Physical Activity: Not on file  Stress: Not on file  Social Connections:  Not on file   No Known Allergies Family History  Problem Relation Age of Onset   Hypertension Mother    Hyperlipidemia Mother    Stroke Father 48   Hypertension Father    Heart disease Father        CHF   Parkinsonism Father    Hypertension Sister    Hypertension Brother    Hypertension Sister    Migraines Brother    Diabetes Maternal Uncle    Parkinsonism Maternal Uncle    Diabetes Maternal Grandmother    Heart disease Maternal Grandmother    Stroke Maternal Grandmother    Diabetes Maternal Grandfather    Stroke Maternal Grandfather 65   Heart disease Paternal Grandmother     Current Outpatient Medications (Endocrine & Metabolic):    predniSONE (DELTASONE) 20 MG tablet, Take 2 tablets (40 mg total) by mouth daily with  breakfast.  Current Outpatient Medications (Cardiovascular):    nitroGLYCERIN (NITRO-DUR) 0.2 mg/hr patch, Place 1 patch (0.2 mg total) onto the skin daily. Apply quarter (1/4) of a patch daily to skin.  Current Outpatient Medications (Respiratory):    loratadine (CLARITIN) 10 MG tablet, Take 10 mg by mouth daily.      Current Outpatient Medications (Other):    Multiple Vitamin (MULTIVITAMIN) tablet, Take 1 tablet by mouth daily.     Reviewed prior external information including notes and imaging from  primary care provider As well as notes that were available from care everywhere and other healthcare systems.  Past medical history, social, surgical and family history all reviewed in electronic medical record.  No pertanent information unless stated regarding to the chief complaint.   Review of Systems:  No headache, visual changes, nausea, vomiting, diarrhea, constipation, dizziness, abdominal pain, skin rash, fevers, chills, night sweats, weight loss, swollen lymph nodes, body aches, joint swelling, chest pain, shortness of breath, mood changes. POSITIVE muscle aches  Objective  Blood pressure 120/70, pulse 62, height 5\' 9"  (1.753 m), weight 152 lb (68.9 kg), last menstrual period 12/11/2010, SpO2 99 %.   General: No apparent distress alert and oriented x3 mood and affect normal, dressed appropriately.  HEENT: Pupils equal, extraocular movements intact  Respiratory: Patient's speak in full sentences and does not appear short of breath  Cardiovascular: No lower extremity edema, non tender, no erythema  Gait normal with good balance and coordination.  MSK:  foot exam shows patient does have breakdown of the longitudinal arch left greater than right.  Patient does have severe tenderness to palpation over the plantar aspect of the calcaneal area left side.  Limited muscular skeletal ultrasound was performed and interpreted by 12/13/2010, M  Limited ultrasound of patient's left  plantar fascial still shows significant hypoechoic changes with neovascularization consistent with sterile interstitial tearing noted near the origin of the calcaneal area.  Minimal difference from previous exam.   Impression and Recommendations:     The above documentation has been reviewed and is accurate and complete Antoine Primas, DO

## 2021-01-23 ENCOUNTER — Ambulatory Visit (INDEPENDENT_AMBULATORY_CARE_PROVIDER_SITE_OTHER): Payer: BC Managed Care – PPO | Admitting: Family Medicine

## 2021-01-23 ENCOUNTER — Encounter: Payer: Self-pay | Admitting: Family Medicine

## 2021-01-23 ENCOUNTER — Other Ambulatory Visit: Payer: Self-pay

## 2021-01-23 ENCOUNTER — Ambulatory Visit: Payer: Self-pay

## 2021-01-23 VITALS — BP 120/70 | HR 62 | Ht 69.0 in | Wt 152.0 lb

## 2021-01-23 DIAGNOSIS — M722 Plantar fascial fibromatosis: Secondary | ICD-10-CM | POA: Diagnosis not present

## 2021-01-23 NOTE — Patient Instructions (Addendum)
MRI [facility] PRP possibly When we receive your results we will contact you.

## 2021-01-23 NOTE — Assessment & Plan Note (Addendum)
Patient has improved significantly on the right side but unfortunately continues to have discomfort on the left side.  Still has an area that does appear to be a potential tear in the plantar fascial that is very severe.  Discussed with patient about potentially discontinuing the nitroglycerin.  Failed physical therapy, do feel at this point that advanced imaging would be warranted to see if patient would be a candidate for PRP or if surgical intervention is necessary.  Does have a moderate calcaneal heel spur as well that further evaluation for any type of stress reaction could also be occurring.  After imaging we will discuss different treatment options total time discussing with patient, discussing previous imaging and different treatment options 31 minutes

## 2021-01-25 ENCOUNTER — Other Ambulatory Visit: Payer: Self-pay

## 2021-01-25 ENCOUNTER — Ambulatory Visit (INDEPENDENT_AMBULATORY_CARE_PROVIDER_SITE_OTHER): Payer: BC Managed Care – PPO

## 2021-01-25 DIAGNOSIS — M25572 Pain in left ankle and joints of left foot: Secondary | ICD-10-CM

## 2021-01-25 DIAGNOSIS — M722 Plantar fascial fibromatosis: Secondary | ICD-10-CM | POA: Diagnosis not present

## 2021-01-25 DIAGNOSIS — M79672 Pain in left foot: Secondary | ICD-10-CM

## 2021-01-25 DIAGNOSIS — R600 Localized edema: Secondary | ICD-10-CM | POA: Diagnosis not present

## 2021-01-25 DIAGNOSIS — M7732 Calcaneal spur, left foot: Secondary | ICD-10-CM | POA: Diagnosis not present

## 2021-02-12 ENCOUNTER — Encounter: Payer: Self-pay | Admitting: Family Medicine

## 2021-02-13 NOTE — Progress Notes (Signed)
[name] [address] [place] [address] Phone: [phone] Subjective:   Kristen Wilkerson, am serving as a scribe for Dr. Antoine Primas.  This visit occurred during the SARS-CoV-2 public health emergency.  Safety protocols were in place, including screening questions prior to the visit, additional usage of staff PPE, and extensive cleaning of exam room while observing appropriate contact time as indicated for disinfecting solutions.   I'm seeing this patient by the request  of:  [name], MD  CC: Foot pain follow-up  [id number]  Kristen Wilkerson is a 60 y.o. female coming in with complaint of L foot pain. Here for PRP for plantar fascia. Patient states continued to have pain.  Here for PRP.  MRI did show the patient does have mild plantar fasciitis of the medial band of the plantar fascia at the calcaneal insertion with mild bone marrow edema.  This was independently visualized again today. Patient states that is improving but is still not where she would like to be at.       Past Medical History:  Diagnosis Date   Allergic rhinitis, cause unspecified    Claritin, Flonase seasonally   Exercise-induced asthma    related to weather changes/allergies; had normal stress echo for evaluation of DOE   Palpitations    Past Surgical History:  Procedure Laterality Date   BREAST BIOPSY Right 2010   THYROID CYST EXCISION  18   TONSILLECTOMY  19   uterine cyst removal     Social History   Socioeconomic History   Marital status: Single    Spouse name: Not on file   Number of children: 0   Years of education: Not on file   Highest education level: Not on file  Occupational History   Occupation: management (product development)    Employer: [employer]  Tobacco Use   Smoking status: Never   Smokeless tobacco: Never  Vaping Use   Vaping Use: Never used  Substance and Sexual Activity   Alcohol use: Yes    Alcohol/week: 4.0 standard  drinks    Types: 4 Glasses of wine per week    Comment: 3-4 drinks per week.   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      Tobacco: none      Alcohol: 3-5 alcoholic beverages per week      Exercise: hike, walking, biking, running, yoga; daily exercise; gym membership      Seatbelt: 100%      Sexual activity: in 2019, no sexual activity since 2014.     Social Determinants of Health   Financial Resource Strain: Not on file  Food Insecurity: Not on file  Transportation Needs: Not on file  Physical Activity: Not on file  Stress: Not on file  Social Connections: Not on file   No Known Allergies Family History  Problem Relation Age of Onset   Hypertension Mother    Hyperlipidemia Mother    Stroke Father 18   Hypertension Father    Heart disease Father        CHF   Parkinsonism  Father    Hypertension Sister    Hypertension Brother    Hypertension Sister    Migraines Brother    Diabetes Maternal Uncle    Parkinsonism Maternal Uncle    Diabetes Maternal Grandmother    Heart disease Maternal Grandmother    Stroke Maternal Grandmother    Diabetes Maternal Grandfather    Stroke Maternal Grandfather 109   Heart disease Paternal Grandmother     Current Outpatient Medications (Endocrine & Metabolic):    predniSONE (DELTASONE) 20 MG tablet, Take 2 tablets (40 mg total) by mouth daily with breakfast.  Current Outpatient Medications (Cardiovascular):    nitroGLYCERIN (NITRO-DUR) 0.2 mg/hr patch, Place 1 patch (0.2 mg total) onto the skin daily. Apply quarter (1/4) of a patch daily to skin.  Current Outpatient Medications (Respiratory):    loratadine (CLARITIN) 10 MG tablet, Take 10  mg by mouth daily.      Current Outpatient Medications (Other):    Multiple Vitamin (MULTIVITAMIN) tablet, Take 1 tablet by mouth daily.     Reviewed prior external information including notes and imaging from  primary care provider As well as notes that were available from care everywhere and other healthcare systems.  Past medical history, social, surgical and family history all reviewed in electronic medical record.  No pertanent information unless stated regarding to the chief complaint.   Review of Systems:  No headache, visual changes, nausea, vomiting, diarrhea, constipation, dizziness, abdominal pain, skin rash, fevers, chills, night sweats, weight loss, swollen lymph nodes, body aches, joint swelling, chest pain, shortness of breath, mood changes. POSITIVE muscle aches  Objective  Last menstrual period 12/11/2010.   General: No apparent distress alert and oriented x3 mood and affect normal, dressed appropriately.  HEENT: Pupils equal, extraocular movements intact    Procedure: Real-time Ultrasound Guided Injection of left plantar fascia Device: GE Logiq Q7 Ultrasound guided injection is preferred based studies that show increased duration, increased effect, greater accuracy, decreased procedural pain, increased response rate, and decreased cost with ultrasound guided versus blind injection.  Verbal informed consent obtained.  Time-out conducted.  Noted no overlying erythema, induration, or other signs of local infection.  Skin prepped in a sterile fashion.  Local anesthesia: Topical Ethyl chloride.  With sterile technique and under real time ultrasound guidance: With a 21-gauge 2 inch needle with a medial approach patient was injected with 0.5 cc of 0.5% Marcaine then injected with 3 cc of  precentrifuge PRP. Completed without difficulty  Pain immediately improved  suggesting accurate placement of the medication.  Advised to call if fevers/chills, erythema, induration,  drainage, or persistent bleeding.  Impression: Technically successful ultrasound guided injection.    Impression and Recommendations:     The above documentation has been reviewed and is accurate and complete Judi Saa, DO

## 2021-02-14 ENCOUNTER — Ambulatory Visit: Payer: Self-pay

## 2021-02-14 ENCOUNTER — Other Ambulatory Visit: Payer: Self-pay

## 2021-02-14 ENCOUNTER — Ambulatory Visit (INDEPENDENT_AMBULATORY_CARE_PROVIDER_SITE_OTHER): Payer: Self-pay | Admitting: Family Medicine

## 2021-02-14 ENCOUNTER — Other Ambulatory Visit: Payer: Self-pay | Admitting: Internal Medicine

## 2021-02-14 ENCOUNTER — Encounter: Payer: Self-pay | Admitting: Family Medicine

## 2021-02-14 VITALS — BP 122/80 | HR 67 | Ht 69.0 in | Wt 153.0 lb

## 2021-02-14 DIAGNOSIS — M722 Plantar fascial fibromatosis: Secondary | ICD-10-CM

## 2021-02-14 DIAGNOSIS — Z09 Encounter for follow-up examination after completed treatment for conditions other than malignant neoplasm: Secondary | ICD-10-CM

## 2021-02-14 DIAGNOSIS — M79672 Pain in left foot: Secondary | ICD-10-CM

## 2021-02-14 NOTE — Patient Instructions (Signed)
No Ice or IBU for 3 days Heat and Tylenol are ok See me again in 6 weeks

## 2021-02-14 NOTE — Assessment & Plan Note (Signed)
Patient given injection today and tolerated the procedure well.  We discussed the post PRP progression.  Continue the nitroglycerin with patient having no side effects.  Patient will increase activity slowly and follow-up with me again in 6 weeks.  Patient knows if any redness or swelling to seek medical attention immediately.

## 2021-03-19 ENCOUNTER — Ambulatory Visit
Admission: RE | Admit: 2021-03-19 | Discharge: 2021-03-19 | Disposition: A | Discharge status: Home / Self Care | Payer: BC Managed Care – PPO | Source: Ambulatory Visit | Attending: Internal Medicine | Admitting: Internal Medicine

## 2021-03-19 DIAGNOSIS — Z09 Encounter for follow-up examination after completed treatment for conditions other than malignant neoplasm: Secondary | ICD-10-CM

## 2021-03-19 DIAGNOSIS — R922 Inconclusive mammogram: Secondary | ICD-10-CM | POA: Diagnosis not present

## 2021-03-26 NOTE — Progress Notes (Signed)
[facility] [address] [place] [address] Phone: [phone] Subjective:   INadine Counts, am serving as a scribe for Dr. Antoine Primas. This visit occurred during the SARS-CoV-2 public health emergency.  Safety protocols were in place, including screening questions prior to the visit, additional usage of staff PPE, and extensive cleaning of exam room while observing appropriate contact time as indicated for disinfecting solutions.   I'm seeing this patient by the request  of:  [name], MD  CC: Foot pain follow-up  [id number]  Kristen Wilkerson is a 60 y.o. female coming in with complaint of B foot pain. L>R. PRP for left plantar fascia on 02/14/2021. Patient states improving since last visit. Less pain less frequency. Walking a couple of miles 3 times as week. Doing well.       Past Medical History:  Diagnosis Date   Allergic rhinitis, cause unspecified    Claritin, Flonase seasonally   Exercise-induced asthma    related to weather changes/allergies; had normal stress echo for evaluation of DOE   Palpitations    Past Surgical History:  Procedure Laterality Date   BREAST BIOPSY Right 2010   THYROID CYST EXCISION  18   TONSILLECTOMY  19   uterine cyst removal     Social History   Socioeconomic History   Marital status: Single    Spouse name: Not on file   Number of children: 0   Years of education: Not on file   Highest education level: Not on file  Occupational History   Occupation: management (product development)    Employer: [employer]  Tobacco Use   Smoking status: Never   Smokeless tobacco: Never  Vaping Use   Vaping Use: Never used  Substance and Sexual Activity   Alcohol use: Yes    Alcohol/week: 4.0 standard drinks    Types: 4 Glasses of wine per week    Comment: 3-4 drinks per week.   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      Tobacco: none      Alcohol: 3-5 alcoholic beverages per week      Exercise: hike, walking, biking, running, yoga; daily exercise; gym membership      Seatbelt: 100%      Sexual activity: in 2019, no sexual activity since 2014.     Social Determinants of Health   Financial Resource Strain: Not on file  Food Insecurity: Not on file  Transportation Needs: Not on file  Physical Activity: Not on file  Stress: Not on file  Social Connections: Not on file   No Known Allergies Family History  Problem Relation Age of Onset   Hypertension Mother    Hyperlipidemia Mother    Stroke Father 37   Hypertension Father    Heart disease Father        CHF   Parkinsonism Father    Hypertension Sister    Hypertension Brother    Hypertension Sister    Migraines Brother    Diabetes Maternal Uncle    Parkinsonism Maternal Uncle    Diabetes Maternal Grandmother  Heart disease Maternal Grandmother    Stroke Maternal Grandmother    Diabetes Maternal Grandfather    Stroke Maternal Grandfather 47   Heart disease Paternal Grandmother      Current Outpatient Medications (Cardiovascular):    nitroGLYCERIN (NITRO-DUR) 0.2 mg/hr patch, Place 1 patch (0.2 mg total) onto the skin daily. Apply quarter (1/4) of a patch daily to skin.  Current Outpatient Medications (Respiratory):    loratadine (CLARITIN) 10 MG tablet, Take 10 mg by mouth daily.      Current Outpatient Medications (Other):    Multiple Vitamin (MULTIVITAMIN) tablet, Take 1 tablet by mouth daily.     Reviewed prior external information including notes and imaging from  primary care provider As well as notes that were available from care everywhere and other healthcare systems.  Past medical history,  social, surgical and family history all reviewed in electronic medical record.  No pertanent information unless stated regarding to the chief complaint.   Review of Systems:  No headache, visual changes, nausea, vomiting, diarrhea, constipation, dizziness, abdominal pain, skin rash, fevers, chills, night sweats, weight loss, swollen lymph nodes, body aches, joint swelling, chest pain, shortness of breath, mood changes. POSITIVE muscle aches  Objective  Blood pressure 112/82, pulse 64, weight 153 lb (69.4 kg), last menstrual period 12/11/2010, SpO2 99 %.   General: No apparent distress alert and oriented x3 mood and affect normal, dressed appropriately.  HEENT: Pupils equal, extraocular movements intact  Respiratory: Patient's speak in full sentences and does not appear short of breath  Cardiovascular: No lower extremity edema, non tender, no erythema  Gait  Foot exam shows patient is minorly tender still over the medial calcaneal area noted.  No significant swelling.  Patient has improvement in range of motion of the ankle overall though.  5 out of 5 strength.  Limited muscular skeletal ultrasound was performed and interpreted by Antoine Primas, M  Limited ultrasound shows patient has significant decrease in the hypoechoic changes that was previously seen.  Plantar fascial does seem to be nearly in the normal Impression: Interval improvement   Impression and Recommendations:    The above documentation has been reviewed and is accurate and complete Judi Saa, DO

## 2021-03-27 ENCOUNTER — Other Ambulatory Visit: Payer: Self-pay

## 2021-03-27 ENCOUNTER — Ambulatory Visit: Payer: Self-pay

## 2021-03-27 ENCOUNTER — Ambulatory Visit (INDEPENDENT_AMBULATORY_CARE_PROVIDER_SITE_OTHER): Payer: BC Managed Care – PPO | Admitting: Family Medicine

## 2021-03-27 ENCOUNTER — Encounter: Payer: Self-pay | Admitting: Family Medicine

## 2021-03-27 VITALS — BP 112/82 | HR 64 | Wt 153.0 lb

## 2021-03-27 DIAGNOSIS — M722 Plantar fascial fibromatosis: Secondary | ICD-10-CM

## 2021-03-27 NOTE — Patient Instructions (Signed)
Looks phenomenal Still ice after activity Once more check in in 6-8 weeks

## 2021-03-27 NOTE — Assessment & Plan Note (Signed)
Patient is unremarkably well with the PRP.  Appears to be approximately 80% better than previous exam.  Discussed with patient icing regimen and home exercises.  Discussed we can start increasing activity as tolerated.  Follow-up 1 more time in 6 to 8 weeks and hopefully fully release patient at that time

## 2021-04-01 ENCOUNTER — Ambulatory Visit: Payer: BC Managed Care – PPO | Admitting: Family Medicine

## 2021-05-07 NOTE — Progress Notes (Signed)
[name] [address] [place] [address] Phone: [phone] Subjective:   Kristen Wilkerson, am serving as a scribe for Dr. Antoine Primas.This visit occurred during the SARS-CoV-2 public health emergency.  Safety protocols were in place, including screening questions prior to the visit, additional usage of staff PPE, and extensive cleaning of exam room while observing appropriate contact time as indicated for disinfecting solutions.  I'm seeing this patient by the request  of:  [name], MD  CC: Foot pain follow-up  [id number]  03/27/2021 Patient is unremarkably well with the PRP.  Appears to be approximately 80% better than previous exam.  Discussed with patient icing regimen and home exercises.  Discussed we can start increasing activity as tolerated.  Follow-up 1 more time in 6 to 8 weeks and hopefully fully release patient at that time  Update 05/08/2021 Kristen Wilkerson is a 61 y.o. female coming in with complaint of B foot pain. Patient states that she is doing much better. Discomfort in L heel with standing for prolonged periods but recovers over night. Feels like lump in bottom other than that patient feels like she has no significant discomfort.       Past Medical History:  Diagnosis Date   Allergic rhinitis, cause unspecified    Claritin, Flonase seasonally   Exercise-induced asthma    related to weather changes/allergies; had normal stress echo for evaluation of DOE   Palpitations    Past Surgical History:  Procedure Laterality Date   BREAST BIOPSY Right 2010   THYROID CYST EXCISION  18   TONSILLECTOMY  19   uterine cyst removal     Social History   Socioeconomic History   Marital status: Single    Spouse name: Not on file   Number of children: 0   Years of education: Not on file   Highest education level: Not on file  Occupational History   Occupation: management (product development)    Employer: [employer]   Tobacco Use   Smoking status: Never   Smokeless tobacco: Never  Vaping Use   Vaping Use: Never used  Substance and Sexual Activity   Alcohol use: Yes    Alcohol/week: 4.0 standard drinks    Types: 4 Glasses of wine per week    Comment: 3-4 drinks per week.   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      Tobacco: none      Alcohol: 3-5 alcoholic beverages per week      Exercise: hike, walking, biking, running, yoga; daily exercise; gym membership      Seatbelt: 100%      Sexual activity: in 2019, no sexual activity since 2014.     Social Determinants of Health   Financial Resource Strain: Not on file  Food Insecurity: Not on file  Transportation Needs: Not on file  Physical Activity: Not on file  Stress: Not on file  Social Connections: Not on file   No Known Allergies Family History  Problem Relation Age of Onset   Hypertension Mother    Hyperlipidemia Mother  Stroke Father 30   Hypertension Father    Heart disease Father        CHF   Parkinsonism Father    Hypertension Sister    Hypertension Brother    Hypertension Sister    Migraines Brother    Diabetes Maternal Uncle    Parkinsonism Maternal Uncle    Diabetes Maternal Grandmother    Heart disease Maternal Grandmother    Stroke Maternal Grandmother    Diabetes Maternal Grandfather    Stroke Maternal Grandfather 65   Heart disease Paternal Grandmother      Current Outpatient Medications (Cardiovascular):    nitroGLYCERIN (NITRO-DUR) 0.2 mg/hr patch, Place 1 patch (0.2 mg total) onto the skin daily. Apply quarter (1/4) of a patch daily to skin.  Current Outpatient Medications (Respiratory):    loratadine  (CLARITIN) 10 MG tablet, Take 10 mg by mouth daily.      Current Outpatient Medications (Other):    Multiple Vitamin (MULTIVITAMIN) tablet, Take 1 tablet by mouth daily.     Reviewed prior external information including notes and imaging from  primary care provider As well as notes that were available from care everywhere and other healthcare systems.  Past medical history, social, surgical and family history all reviewed in electronic medical record.  No pertanent information unless stated regarding to the chief complaint.   Review of Systems:  No headache, visual changes, nausea, vomiting, diarrhea, constipation, dizziness, abdominal pain, skin rash, fevers, chills, night sweats, weight loss, swollen lymph nodes, body aches, joint swelling, chest pain, shortness of breath, mood changes. POSITIVE muscle aches  Objective  Blood pressure 118/78, pulse 73, height 5\' 9"  (1.753 m), weight 146 lb (66.2 kg), last menstrual period 12/11/2010, SpO2 99 %.   General: No apparent distress alert and oriented x3 mood and affect normal, dressed appropriately.  HEENT: Pupils equal, extraocular movements intact  Respiratory: Patient's speak in full sentences and does not appear short of breath  Cardiovascular: No lower extremity edema, non tender, no erythema  Gait normal with good balance and coordination.  MSK: Left knee exam shows the patient does have very mild tenderness at the medial calcaneal area as well as on the plantar aspect.  Any significant improvement though noted.  Patient does have improvement as well noted with flexion and extension.   Limited muscular skeletal ultrasound was performed and interpreted by 12/13/2010, M  Limited ultrasound shows the patient's plantar fascia seems to be unremarkable with very minimal increase in size.  Patient does have some soft tissue hypoechoic changes that could be consistent with a small cyst overlying the plantar aspect of the foot.  It measures  less than 2 mm. Impression: Interval improvement of the plantar fasciitis   Impression and Recommendations:     The above documentation has been reviewed and is accurate and complete Antoine Primas, DO

## 2021-05-08 ENCOUNTER — Other Ambulatory Visit: Payer: Self-pay

## 2021-05-08 ENCOUNTER — Encounter: Payer: Self-pay | Admitting: Family Medicine

## 2021-05-08 ENCOUNTER — Ambulatory Visit: Payer: Self-pay

## 2021-05-08 ENCOUNTER — Ambulatory Visit (INDEPENDENT_AMBULATORY_CARE_PROVIDER_SITE_OTHER): Payer: BC Managed Care – PPO | Admitting: Family Medicine

## 2021-05-08 VITALS — BP 118/78 | HR 73 | Ht 69.0 in | Wt 146.0 lb

## 2021-05-08 DIAGNOSIS — M722 Plantar fascial fibromatosis: Secondary | ICD-10-CM

## 2021-05-08 DIAGNOSIS — M79672 Pain in left foot: Secondary | ICD-10-CM | POA: Diagnosis not present

## 2021-05-08 DIAGNOSIS — M79671 Pain in right foot: Secondary | ICD-10-CM

## 2021-05-08 NOTE — Assessment & Plan Note (Signed)
Patient significantly hard at this time.  Has made significant improvement.  Rotator cuff appears to be completely unremarkable at this time patient does have a very mild overlying subcutaneous cyst that we may need to monitor if it enlarges but nothing to do at this time.  Follow-up with me as needed

## 2021-09-08 ENCOUNTER — Encounter: Payer: Self-pay | Admitting: Internal Medicine

## 2021-09-08 NOTE — Progress Notes (Signed)
Subjective:    Patient ID: Kristen Wilkerson, female    DOB: 02/03/1961, 61 y.o.   MRN: [id number]      HPI Kristen Wilkerson is here for a Physical exam.   She has no concerns.  The plantar fasciitis is better.  She is back to being very active.   Medications and allergies reviewed with patient and updated if appropriate.  Current Outpatient Medications on File Prior to Visit  Medication Sig Dispense Refill   loratadine (CLARITIN) 10 MG tablet Take 10 mg by mouth daily.       No current facility-administered medications on file prior to visit.    Review of Systems  Constitutional:  Negative for fever.  Eyes:  Negative for visual disturbance.  Respiratory:  Negative for cough, shortness of breath and wheezing.   Cardiovascular:  Negative for chest pain, palpitations and leg swelling.  Gastrointestinal:  Negative for abdominal pain, blood in stool, constipation, diarrhea and nausea.       No gerd  Genitourinary:  Negative for dysuria.  Musculoskeletal:  Positive for back pain (mild at times). Negative for arthralgias.  Skin:  Negative for rash.  Neurological:  Negative for light-headedness and headaches.  Psychiatric/Behavioral:  Negative for dysphoric mood. The patient is not nervous/anxious.        Objective:   Vitals:   09/09/21 0812  BP: 114/74  Pulse: (!) 51  Temp: 98 F (36.7 C)  SpO2: 99%   Filed Weights   09/09/21 0812  Weight: 132 lb 3.2 oz (60 kg)   Body mass index is 19.52 kg/m.  BP Readings from Last 3 Encounters:  09/09/21 114/74  05/08/21 118/78  03/27/21 112/82    Wt Readings from Last 3 Encounters:  09/09/21 132 lb 3.2 oz (60 kg)  05/08/21 146 lb (66.2 kg)  03/27/21 153 lb (69.4 kg)       Physical Exam Constitutional: She appears well-developed and well-nourished. No distress.  HENT:  Head: Normocephalic and atraumatic.  Right Ear: External ear normal. Normal ear canal and TM Left Ear: External ear normal.  Normal ear canal and  TM Mouth/Throat: Oropharynx is clear and moist.  Eyes: Conjunctivae normal.  Neck: Neck supple. No tracheal deviation present. No thyromegaly present.  No carotid bruit  Cardiovascular: Normal rate, regular rhythm and normal heart sounds.   No murmur heard.  No edema. Pulmonary/Chest: Effort normal and breath sounds normal. No respiratory distress. She has no wheezes. She has no rales.  Breast: deferred   Abdominal: Soft. She exhibits no distension. There is no tenderness.  Lymphadenopathy: She has no cervical adenopathy.  Skin: Skin is warm and dry. She is not diaphoretic.  Psychiatric: She has a normal mood and affect. Her behavior is normal.     Lab Results  Component Value Date   WBC 7.7 02/14/2020   HGB 14.3 02/14/2020   HCT 41.8 02/14/2020   PLT 239.0 02/14/2020   GLUCOSE 86 02/14/2020   CHOL 176 02/14/2020   TRIG 93.0 02/14/2020   HDL 63.70 02/14/2020   LDLCALC 93 02/14/2020   ALT 11 02/14/2020   AST 15 02/14/2020   NA 139 02/14/2020   K 4.3 02/14/2020   CL 105 02/14/2020   CREATININE 0.62 02/14/2020   BUN 14 02/14/2020   CO2 26 02/14/2020   TSH 2.56 02/14/2020         Assessment & Plan:   Physical exam: Screening blood work  ordered Exercise  regular -yoga, walking, hiking Weight  normal Substance abuse  none   Reviewed recommended immunizations.  DEXA ordered-we will get done in December-depending on results may need to start taking calcium/vitamin D/vitamin K   Health Maintenance  Topic Date Due   COLONOSCOPY (Pts 45-35yrs Insurance coverage will need to be confirmed)  Never done   COVID-19 Vaccine (4 - Pfizer series) 09/25/2021 (Originally 03/21/2020)   INFLUENZA VACCINE  10/28/2021   PAP SMEAR-Modifier  04/26/2022   MAMMOGRAM  03/20/2023   TETANUS/TDAP  04/27/2027   Hepatitis C Screening  Completed   HIV Screening  Completed   Zoster Vaccines- Shingrix  Completed   HPV VACCINES  Aged Out     Referred to GI for screening colonoscopy-has  never had 1.     See Problem List for Assessment and Plan of chronic medical problems.

## 2021-09-08 NOTE — Patient Instructions (Addendum)
Blood work was ordered.     Medications changes include :   none   A referral was ordered for GI for colonoscopy.     Someone from that office will call you to schedule an appointment.    A bone density was ordered.    Return in about 1 year (around 09/10/2022) for Physical Exam.    Health Maintenance, Female Adopting a healthy lifestyle and getting preventive care are important in promoting health and wellness. Ask your health care provider about: The right schedule for you to have regular tests and exams. Things you can do on your own to prevent diseases and keep yourself healthy. What should I know about diet, weight, and exercise? Eat a healthy diet  Eat a diet that includes plenty of vegetables, fruits, low-fat dairy products, and lean protein. Do not eat a lot of foods that are high in solid fats, added sugars, or sodium. Maintain a healthy weight Body mass index (BMI) is used to identify weight problems. It estimates body fat based on height and weight. Your health care provider can help determine your BMI and help you achieve or maintain a healthy weight. Get regular exercise Get regular exercise. This is one of the most important things you can do for your health. Most adults should: Exercise for at least 150 minutes each week. The exercise should increase your heart rate and make you sweat (moderate-intensity exercise). Do strengthening exercises at least twice a week. This is in addition to the moderate-intensity exercise. Spend less time sitting. Even light physical activity can be beneficial. Watch cholesterol and blood lipids Have your blood tested for lipids and cholesterol at 61 years of age, then have this test every 5 years. Have your cholesterol levels checked more often if: Your lipid or cholesterol levels are high. You are older than 60 years of age. You are at high risk for heart disease. What should I know about cancer screening? Depending on your  health history and family history, you may need to have cancer screening at various ages. This may include screening for: Breast cancer. Cervical cancer. Colorectal cancer. Skin cancer. Lung cancer. What should I know about heart disease, diabetes, and high blood pressure? Blood pressure and heart disease High blood pressure causes heart disease and increases the risk of stroke. This is more likely to develop in people who have high blood pressure readings or are overweight. Have your blood pressure checked: Every 3-5 years if you are 38-34 years of age. Every year if you are 35 years old or older. Diabetes Have regular diabetes screenings. This checks your fasting blood sugar level. Have the screening done: Once every three years after age 64 if you are at a normal weight and have a low risk for diabetes. More often and at a younger age if you are overweight or have a high risk for diabetes. What should I know about preventing infection? Hepatitis B If you have a higher risk for hepatitis B, you should be screened for this virus. Talk with your health care provider to find out if you are at risk for hepatitis B infection. Hepatitis C Testing is recommended for: Everyone born from [birth year] through 1964. Anyone with known risk factors for hepatitis C. Sexually transmitted infections (STIs) Get screened for STIs, including gonorrhea and chlamydia, if: You are sexually active and are younger than 60 years of age. You are older than 59 years of age and your health care provider tells you  that you are at risk for this type of infection. Your sexual activity has changed since you were last screened, and you are at increased risk for chlamydia or gonorrhea. Ask your health care provider if you are at risk. Ask your health care provider about whether you are at high risk for HIV. Your health care provider may recommend a prescription medicine to help prevent HIV infection. If you choose to take  medicine to prevent HIV, you should first get tested for HIV. You should then be tested every 3 months for as long as you are taking the medicine. Pregnancy If you are about to stop having your period (premenopausal) and you may become pregnant, seek counseling before you get pregnant. Take 400 to 800 micrograms (mcg) of folic acid every day if you become pregnant. Ask for birth control (contraception) if you want to prevent pregnancy. Osteoporosis and menopause Osteoporosis is a disease in which the bones lose minerals and strength with aging. This can result in bone fractures. If you are 3 years old or older, or if you are at risk for osteoporosis and fractures, ask your health care provider if you should: Be screened for bone loss. Take a calcium or vitamin D supplement to lower your risk of fractures. Be given hormone replacement therapy (HRT) to treat symptoms of menopause. Follow these instructions at home: Alcohol use Do not drink alcohol if: Your health care provider tells you not to drink. You are pregnant, may be pregnant, or are planning to become pregnant. If you drink alcohol: Limit how much you have to: 0-1 drink a day. Know how much alcohol is in your drink. In the [place] one drink equals one 12 oz bottle of beer (355 mL), one 5 oz glass of wine (148 mL), or one 1 oz glass of hard liquor (44 mL). Lifestyle Do not use any products that contain nicotine or tobacco. These products include cigarettes, chewing tobacco, and vaping devices, such as e-cigarettes. If you need help quitting, ask your health care provider. Do not use street drugs. Do not share needles. Ask your health care provider for help if you need support or information about quitting drugs. General instructions Schedule regular health, dental, and eye exams. Stay current with your vaccines. Tell your health care provider if: You often feel depressed. You have ever been abused or do not feel safe at  home. Summary Adopting a healthy lifestyle and getting preventive care are important in promoting health and wellness. Follow your health care provider's instructions about healthy diet, exercising, and getting tested or screened for diseases. Follow your health care provider's instructions on monitoring your cholesterol and blood pressure. This information is not intended to replace advice given to you by your health care provider. Make sure you discuss any questions you have with your health care provider. Document Revised: 08/05/2020 Document Reviewed: 08/05/2020 Elsevier Patient Education  [place].

## 2021-09-09 ENCOUNTER — Ambulatory Visit (INDEPENDENT_AMBULATORY_CARE_PROVIDER_SITE_OTHER): Payer: BC Managed Care – PPO | Admitting: Internal Medicine

## 2021-09-09 VITALS — BP 114/74 | HR 51 | Temp 98.0°F | Ht 69.0 in | Wt 132.2 lb

## 2021-09-09 DIAGNOSIS — Z1211 Encounter for screening for malignant neoplasm of colon: Secondary | ICD-10-CM | POA: Diagnosis not present

## 2021-09-09 DIAGNOSIS — Z0000 Encounter for general adult medical examination without abnormal findings: Secondary | ICD-10-CM | POA: Diagnosis not present

## 2021-09-09 DIAGNOSIS — J302 Other seasonal allergic rhinitis: Secondary | ICD-10-CM | POA: Diagnosis not present

## 2021-09-09 DIAGNOSIS — Z13820 Encounter for screening for osteoporosis: Secondary | ICD-10-CM

## 2021-09-09 DIAGNOSIS — E2839 Other primary ovarian failure: Secondary | ICD-10-CM

## 2021-09-09 LAB — CBC WITH DIFFERENTIAL/PLATELET
Basophils Absolute: 0 10*3/uL (ref 0.0–0.1)
Basophils Relative: 0.7 % (ref 0.0–3.0)
Eosinophils Absolute: 0.3 10*3/uL (ref 0.0–0.7)
Eosinophils Relative: 4.5 % (ref 0.0–5.0)
HCT: 40.9 % (ref 36.0–46.0)
Hemoglobin: 14 g/dL (ref 12.0–15.0)
Lymphocytes Relative: 31.1 % (ref 12.0–46.0)
Lymphs Abs: 1.9 10*3/uL (ref 0.7–4.0)
MCHC: 34.1 g/dL (ref 30.0–36.0)
MCV: 96.1 fl (ref 78.0–100.0)
Monocytes Absolute: 0.5 10*3/uL (ref 0.1–1.0)
Monocytes Relative: 8.3 % (ref 3.0–12.0)
Neutro Abs: 3.3 10*3/uL (ref 1.4–7.7)
Neutrophils Relative %: 55.4 % (ref 43.0–77.0)
Platelets: 222 10*3/uL (ref 150.0–400.0)
RBC: 4.25 Mil/uL (ref 3.87–5.11)
RDW: 12.7 % (ref 11.5–15.5)
WBC: 6 10*3/uL (ref 4.0–10.5)

## 2021-09-09 LAB — COMPREHENSIVE METABOLIC PANEL
ALT: 14 U/L (ref 0–35)
AST: 19 U/L (ref 0–37)
Albumin: 4.5 g/dL (ref 3.5–5.2)
Alkaline Phosphatase: 91 U/L (ref 39–117)
BUN: 13 mg/dL (ref 6–23)
CO2: 27 mEq/L (ref 19–32)
Calcium: 9.8 mg/dL (ref 8.4–10.5)
Chloride: 105 mEq/L (ref 96–112)
Creatinine, Ser: 0.74 mg/dL (ref 0.40–1.20)
GFR: 88.12 mL/min (ref >60.00)
Glucose, Bld: 94 mg/dL (ref 70–99)
Potassium: 4.1 mEq/L (ref 3.5–5.1)
Sodium: 139 mEq/L (ref 135–145)
Total Bilirubin: 1.1 mg/dL (ref 0.2–1.2)
Total Protein: 7 g/dL (ref 6.0–8.3)

## 2021-09-09 LAB — LIPID PANEL
Cholesterol: 195 mg/dL (ref 0–200)
HDL: 83.8 mg/dL (ref >39.00)
LDL Cholesterol: 98 mg/dL (ref 0–99)
NonHDL: 111.61
Total CHOL/HDL Ratio: 2
Triglycerides: 68 mg/dL (ref 0.0–149.0)
VLDL: 13.6 mg/dL (ref 0.0–40.0)

## 2021-09-09 LAB — TSH: TSH: 2.4 u[IU]/mL (ref 0.35–5.50)

## 2021-09-09 NOTE — Assessment & Plan Note (Signed)
Chronic Controlled, stable Continue loratadine 10 mg daily

## 2021-09-19 ENCOUNTER — Encounter: Payer: Self-pay | Admitting: Gastroenterology

## 2021-09-19 ENCOUNTER — Other Ambulatory Visit: Payer: Self-pay | Admitting: Internal Medicine

## 2021-09-19 DIAGNOSIS — Z1231 Encounter for screening mammogram for malignant neoplasm of breast: Secondary | ICD-10-CM

## 2021-10-08 ENCOUNTER — Ambulatory Visit (AMBULATORY_SURGERY_CENTER): Payer: Self-pay | Admitting: *Deleted

## 2021-10-08 VITALS — Ht 69.0 in | Wt 132.8 lb

## 2021-10-08 DIAGNOSIS — Z1211 Encounter for screening for malignant neoplasm of colon: Secondary | ICD-10-CM

## 2021-10-08 NOTE — Progress Notes (Signed)
No egg or soy allergy known to patient  No issues known to pt with past sedation with any surgeries or procedures Patient denies ever being told they had issues or difficulty with intubation  No FH of Malignant Hyperthermia Pt is not on diet pills Pt is not on  home 02  Pt is not on blood thinners  Pt denies issues with constipation  No A fib or A flutter Have any cardiac testing pending--pt denies 

## 2021-11-05 ENCOUNTER — Encounter: Payer: BC Managed Care – PPO | Admitting: Gastroenterology

## 2021-11-10 ENCOUNTER — Encounter: Payer: Self-pay | Admitting: Gastroenterology

## 2021-11-17 ENCOUNTER — Ambulatory Visit (AMBULATORY_SURGERY_CENTER): Payer: BC Managed Care – PPO | Admitting: Gastroenterology

## 2021-11-17 ENCOUNTER — Encounter: Payer: Self-pay | Admitting: Gastroenterology

## 2021-11-17 VITALS — BP 124/73 | HR 56 | Temp 96.0°F | Resp 10 | Ht 69.0 in | Wt 132.8 lb

## 2021-11-17 DIAGNOSIS — Z1211 Encounter for screening for malignant neoplasm of colon: Secondary | ICD-10-CM | POA: Diagnosis not present

## 2021-11-17 MED ORDER — SODIUM CHLORIDE 0.9 % IV SOLN: 500.0000 mL | Freq: Once | INTRAVENOUS | Status: DC

## 2021-11-17 NOTE — Progress Notes (Signed)
Pt's states no medical or surgical changes since previsit or office visit. 

## 2021-11-17 NOTE — Op Note (Signed)
[facility] Patient Name: Kristen Wilkerson Procedure Date: 11/17/2021 9:44 AM MRN: [id number] Endoscopist: Lynann Bologna , MD Age: 61 Referring MD:  Date of Birth: [birth year] Gender: Female Account #: [id number] Procedure:                Colonoscopy Indications:              Screening for colorectal malignant neoplasm Medicines:                Monitored Anesthesia Care Procedure:                Pre-Anesthesia Assessment:                           - Prior to the procedure, a History and Physical                            was performed, and patient medications and                            allergies were reviewed. The patient's tolerance of                            previous anesthesia was also reviewed. The risks                            and benefits of the procedure and the sedation                            options and risks were discussed with the patient.                            All questions were answered, and informed consent                            was obtained. Prior Anticoagulants: The patient has                            taken no previous anticoagulant or antiplatelet                            agents. ASA Grade Assessment: II - A patient with                            mild systemic disease. After reviewing the risks                            and benefits, the patient was deemed in                            satisfactory condition to undergo the procedure.                           After obtaining informed consent, the colonoscope  was passed under direct vision. Throughout the                            procedure, the patient's blood pressure, pulse, and                            oxygen saturations were monitored continuously. The                            Olympus PCF-H190DL (#[id number]) Colonoscope was                            introduced through the anus and advanced to the 2                            cm into the ileum. The  colonoscopy was performed                            without difficulty. The patient tolerated the                            procedure well. The quality of the bowel                            preparation was good. The terminal ileum, ileocecal                            valve, appendiceal orifice, and rectum were                            photographed. Scope In: 9:45:33 AM Scope Out: 10:04:31 AM Scope Withdrawal Time: 0 hours 12 minutes 2 seconds  Total Procedure Duration: 0 hours 18 minutes 58 seconds  Findings:                 The colon (entire examined portion) appeared                            normal. The colon was highly redundant.                           Non-bleeding internal hemorrhoids were found during                            retroflexion. The hemorrhoids were small and Grade                            I (internal hemorrhoids that do not prolapse).                           The terminal ileum appeared normal.                           The exam was otherwise without abnormality on  direct and retroflexion views. Complications:            No immediate complications. Estimated Blood Loss:     Estimated blood loss: none. Impression:               - Non-bleeding internal hemorrhoids.                           - Otherwise normal colonoscopy to TI. The colon was                            highly redundant.                           - No specimens collected. Recommendation:           - Patient has a contact number available for                            emergencies. The signs and symptoms of potential                            delayed complications were discussed with the                            patient. Return to normal activities tomorrow.                            Written discharge instructions were provided to the                            patient.                           - Resume previous diet.                           - Continue  present medications.                           - Repeat colonoscopy in 10 years for screening                            purposes. Earlier, if with any new problems or                            change in family history.                           - The findings and recommendations were discussed                            with the patient's family. Lynann Bologna, MD 11/17/2021 32:99:24 AM This report has been signed electronically.

## 2021-11-17 NOTE — Progress Notes (Signed)
To pacu, VSS. Report to [email or web address] 

## 2021-11-17 NOTE — Progress Notes (Signed)
[facility] History and Physical   Primary Care Physician:  [name], MD   Reason for Procedure:   CRC screening   Plan:    colon     HPI: Kristen Wilkerson is a 61 y.o. female   No nausea, vomiting, heartburn, regurgitation, odynophagia or dysphagia.  No significant diarrhea or constipation.  No melena or hematochezia. No unintentional weight loss. No abdominal pain.  Past Medical History:  Diagnosis Date   Allergic rhinitis, cause unspecified    Claritin, Flonase seasonally   Exercise-induced asthma    related to weather changes/allergies; had normal stress echo for evaluation of DOE   Palpitations    many years ago    Past Surgical History:  Procedure Laterality Date   BREAST BIOPSY Right 2010   THYROID CYST EXCISION  2017   TONSILLECTOMY  1985   uterine cyst removal      Prior to Admission medications   Medication Sig Start Date End Date Taking? Authorizing Provider  loratadine (CLARITIN) 10 MG tablet Take 10 mg by mouth daily.   Patient not taking: Reported on 10/08/2021    [provider]    Current Outpatient Medications  Medication Sig Dispense Refill   loratadine (CLARITIN) 10 MG tablet Take 10 mg by mouth daily.   (Patient not taking: Reported on 10/08/2021)     Current Facility-Administered Medications  Medication Dose Route Frequency Provider Last Rate Last Admin   0.9 %  sodium chloride infusion  500 mL Intravenous Once Lynann Bologna, MD        Allergies as of 11/17/2021   (No Known Allergies)    Family History  Problem Relation Age of Onset   Hypertension Mother    Hyperlipidemia Mother    Stroke Father 57   Hypertension Father    Heart disease Father        CHF   Parkinsonism Father    Hypertension Sister    Hypertension Sister    Hypertension Brother    Migraines Brother    Diabetes Maternal Uncle    Parkinsonism Maternal Uncle    Diabetes Maternal Grandmother    Heart disease Maternal Grandmother    Stroke  Maternal Grandmother    Diabetes Maternal Grandfather    Stroke Maternal Grandfather 12   Heart disease Paternal Grandmother    Colon cancer Neg Hx    Colon polyps Neg Hx    Esophageal cancer Neg Hx    Rectal cancer Neg Hx    Stomach cancer Neg Hx     Social History   Socioeconomic History   Marital status: Single    Spouse name: Not on file   Number of children: 0   Years of education: Not on file   Highest education level: Not on file  Occupational History   Occupation: Insurance account manager (product development)    Employer: [employer]  Tobacco Use   Smoking status: Never   Smokeless tobacco: Never  Vaping Use   Vaping Use: Never used  Substance and Sexual Activity   Alcohol use: Yes    Alcohol/week: 4.0 standard drinks of alcohol    Types: 4 Glasses of wine per week    Comment: 3-4 drinks per week.   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      Tobacco: none      Alcohol: 3-5 alcoholic beverages per week      Exercise: hike, walking, biking, running, yoga; daily exercise; gym membership      Seatbelt: 100%      Sexual activity: in 2019, no sexual activity since 2014.     Social Determinants of Health   Financial Resource Strain: Low Risk  (02/10/2019)   Overall Financial Resource Strain (CARDIA)    Difficulty of Paying Living Expenses: Not hard at all  Food Insecurity: No Food Insecurity (02/10/2019)   Hunger Vital Sign    Worried About Running Out of Food in the Last Year: Never true    Ran Out of Food in the Last Year: Never true  Transportation Needs: No Transportation Needs (02/10/2019)   PRAPARE - Administrator, Civil Service (Medical): No    Lack of  Transportation (Non-Medical): No  Physical Activity: Sufficiently Active (04/28/2017)   Exercise Vital Sign    Days of Exercise per Week: 5 days    Minutes of Exercise per Session: 30 min  Stress: No Stress Concern Present (02/10/2019)   [employer] of Occupational Health - Occupational Stress Questionnaire    Feeling of Stress : Only a little  Social Connections: Unknown (02/10/2019)   Social Connection and Isolation Panel [NHANES]    Frequency of Communication with Friends and Family: Three times a week    Frequency of Social Gatherings with Friends and Family: Twice a week    Attends Religious Services: Patient refused    Active Member of Clubs or Organizations: Patient refused    Attends Banker Meetings: Patient refused    Marital Status: Patient refused  Intimate Partner Violence: Not At Risk (02/10/2019)   Humiliation, Afraid, Rape, and Kick questionnaire    Fear of Current or Ex-Partner: No    Emotionally Abused: No    Physically Abused: No    Sexually Abused: No    Review of Systems: Positive for none All other review of systems negative except as mentioned in the HPI.  Physical Exam: Vital signs in last 24 hours: @VSRANGES @   General:   Alert,  Well-developed, well-nourished, pleasant and cooperative in NAD Lungs:  Clear throughout to auscultation.   Heart:  Regular rate and rhythm; no murmurs, clicks, rubs,  or gallops. Abdomen:  Soft, nontender and nondistended. Normal bowel sounds.   Neuro/Psych:  Alert and cooperative. Normal mood and affect. A and O x 3    No significant changes were identified.  The patient continues to be an appropriate candidate for the planned procedure and anesthesia.   , MD. [facility] 11/17/2021 9:39 AM@

## 2021-11-17 NOTE — Patient Instructions (Signed)
Handout on hemorrhoids given.  YOU HAD AN ENDOSCOPIC PROCEDURE TODAY AT THE [facility]:   Refer to the procedure report that was given to you for any specific questions about what was found during the examination.  If the procedure report does not answer your questions, please call your gastroenterologist to clarify.  If you requested that your care partner not be given the details of your procedure findings, then the procedure report has been included in a sealed envelope for you to review at your convenience later.  YOU SHOULD EXPECT: Some feelings of bloating in the abdomen. Passage of more gas than usual.  Walking can help get rid of the air that was put into your GI tract during the procedure and reduce the bloating. If you had a lower endoscopy (such as a colonoscopy or flexible sigmoidoscopy) you may notice spotting of blood in your stool or on the toilet paper. If you underwent a bowel prep for your procedure, you may not have a normal bowel movement for a few days.  Please Note:  You might notice some irritation and congestion in your nose or some drainage.  This is from the oxygen used during your procedure.  There is no need for concern and it should clear up in a day or so.  SYMPTOMS TO REPORT IMMEDIATELY:  Following lower endoscopy (colonoscopy or flexible sigmoidoscopy):  Excessive amounts of blood in the stool  Significant tenderness or worsening of abdominal pains  Swelling of the abdomen that is new, acute  Fever of 100F or higher  For urgent or emergent issues, a gastroenterologist can be reached at any hour by calling [phone]. Do not use MyChart messaging for urgent concerns.    DIET:  We do recommend a small meal at first, but then you may proceed to your regular diet.  Drink plenty of fluids but you should avoid alcoholic beverages for 24 hours.  ACTIVITY:  You should plan to take it easy for the rest of today and you should NOT DRIVE or use heavy  machinery until tomorrow (because of the sedation medicines used during the test).    FOLLOW UP: Our staff will call the number listed on your records the next business day following your procedure.  We will call around 7:15- 8:00 am to check on you and address any questions or concerns that you may have regarding the information given to you following your procedure. If we do not reach you, we will leave a message.  If you develop any symptoms (ie: fever, flu-like symptoms, shortness of breath, cough etc.) before then, please call [phone].  If you test positive for Covid 19 in the 2 weeks post procedure, please call and report this information to us.    If any biopsies were taken you will be contacted by phone or by letter within the next 1-3 weeks.  Please call us at [phone] if you have not heard about the biopsies in 3 weeks.    SIGNATURES/CONFIDENTIALITY: You and/or your care partner have signed paperwork which will be entered into your electronic medical record.  These signatures attest to the fact that that the information above on your After Visit Summary has been reviewed and is understood.  Full responsibility of the confidentiality of this discharge information lies with you and/or your care-partner.  

## 2021-11-18 ENCOUNTER — Telehealth: Payer: Self-pay

## 2021-11-18 NOTE — Telephone Encounter (Signed)
  Follow up Call-     11/17/2021    8:50 AM  Call back number  Post procedure Call Back phone  # [phone]  Permission to leave phone message Yes     Patient questions:  Do you have a fever, pain , or abdominal swelling? No. Pain Score  0 *  Have you tolerated food without any problems? Yes.    Have you been able to return to your normal activities? Yes.    Do you have any questions about your discharge instructions: Diet   No. Medications  No. Follow up visit  No.  Do you have questions or concerns about your Care? No.  Actions: * If pain score is 4 or above: No action needed, pain <4.

## 2022-03-20 ENCOUNTER — Ambulatory Visit
Admission: RE | Admit: 2022-03-20 | Discharge: 2022-03-20 | Disposition: A | Discharge status: Home / Self Care | Payer: BC Managed Care – PPO | Source: Ambulatory Visit | Attending: Internal Medicine | Admitting: Internal Medicine

## 2022-03-20 DIAGNOSIS — Z780 Asymptomatic menopausal state: Secondary | ICD-10-CM | POA: Diagnosis not present

## 2022-03-20 DIAGNOSIS — Z1231 Encounter for screening mammogram for malignant neoplasm of breast: Secondary | ICD-10-CM | POA: Diagnosis not present

## 2022-03-20 DIAGNOSIS — E2839 Other primary ovarian failure: Secondary | ICD-10-CM

## 2022-03-20 DIAGNOSIS — Z13820 Encounter for screening for osteoporosis: Secondary | ICD-10-CM

## 2022-04-12 IMAGING — US US BREAST*R* LIMITED INC AXILLA
1 series · 4 of 4 positions shown · non-contrast
Comparison: Previous exam(s).

CLINICAL DATA: Two year interval follow-up of a likely benign mass
involving the UPPER INNER QUADRANT of the RIGHT breast at anterior
depth, possibly an intramammary lymph node. Personal history of
benign core needle biopsy of a RIGHT breast mass at 9 o'clock,
adjacent to the current mass being followed, pathology demonstrating
dilated ducts with inspissated secretions and foamy histiocytes
suggestive of galactocele.

Annual evaluation, LEFT breast.
EXAM:
DIGITAL DIAGNOSTIC BILATERAL MAMMOGRAM WITH TOMOSYNTHESIS AND CAD;
ULTRASOUND RIGHT BREAST LIMITED
TECHNIQUE: Bilateral digital diagnostic mammography and breast tomosynthesis
was performed. The images were evaluated with computer-aided
detection.; Targeted ultrasound examination of the right breast was
performed.

[Series 1: us breast*right* limited inc axilla · 0.06mm/px · 4 of 4 slices shown]
[im 1/4]
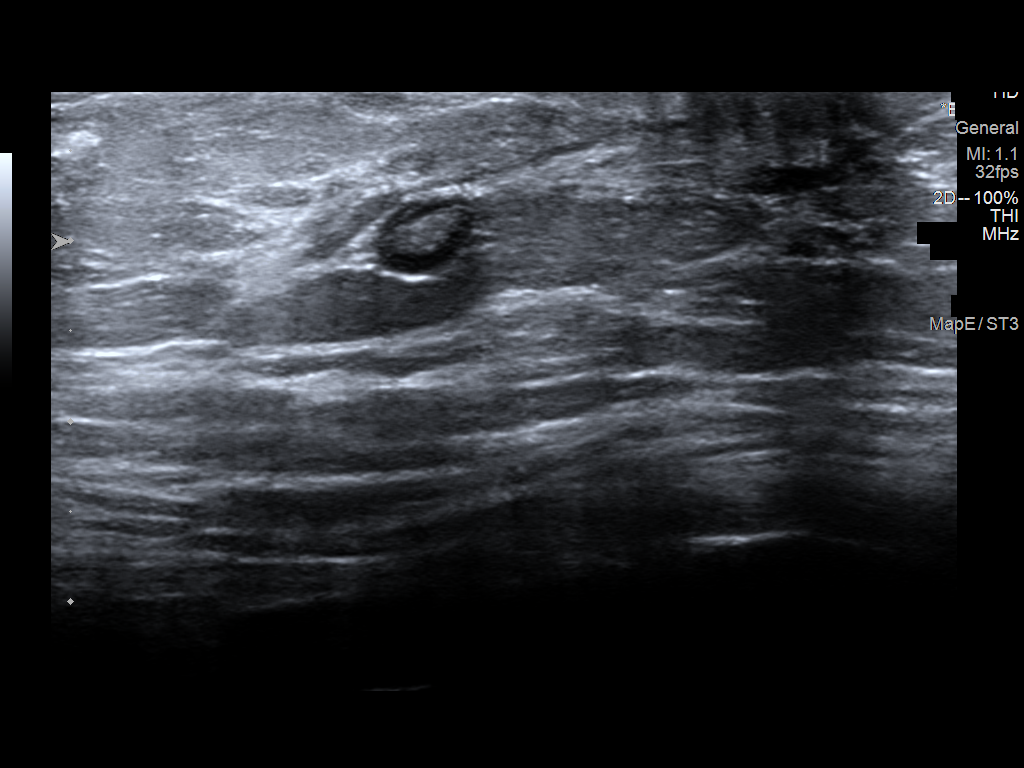
[im 2/4]
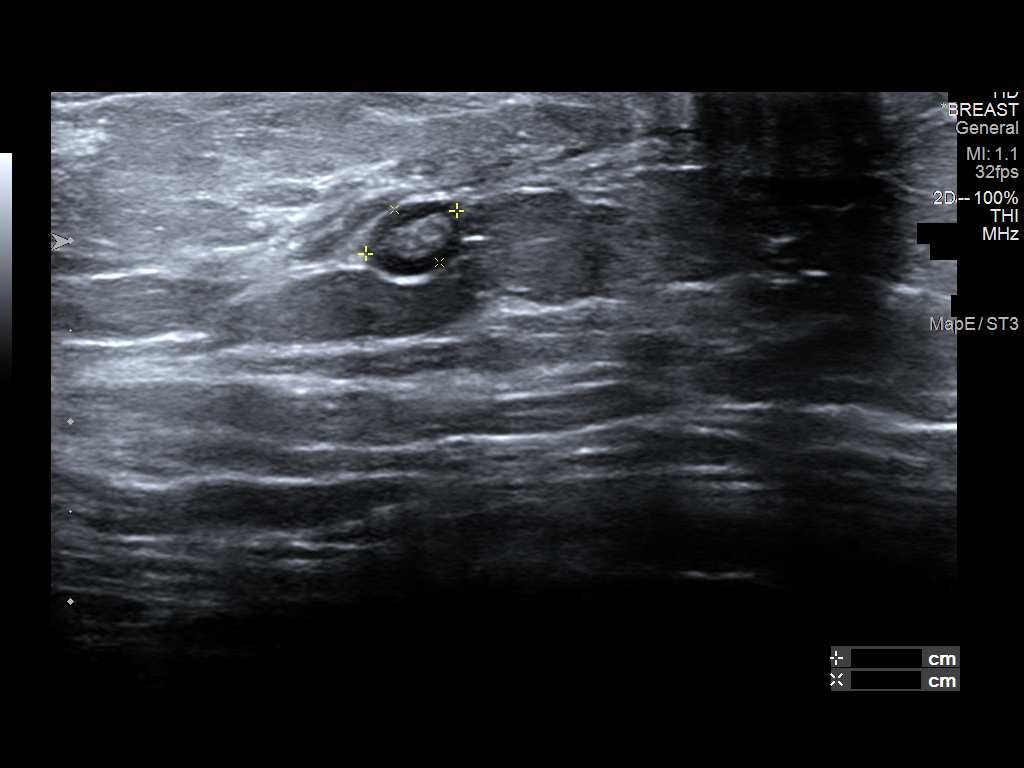
[im 3/4]
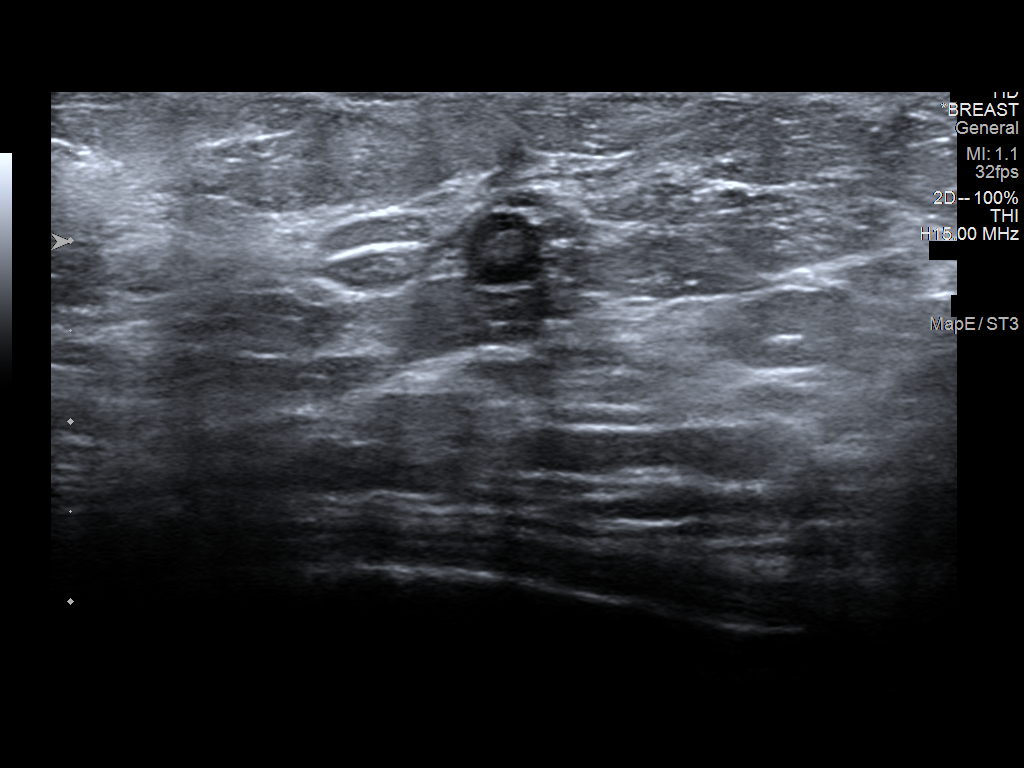
[im 4/4]
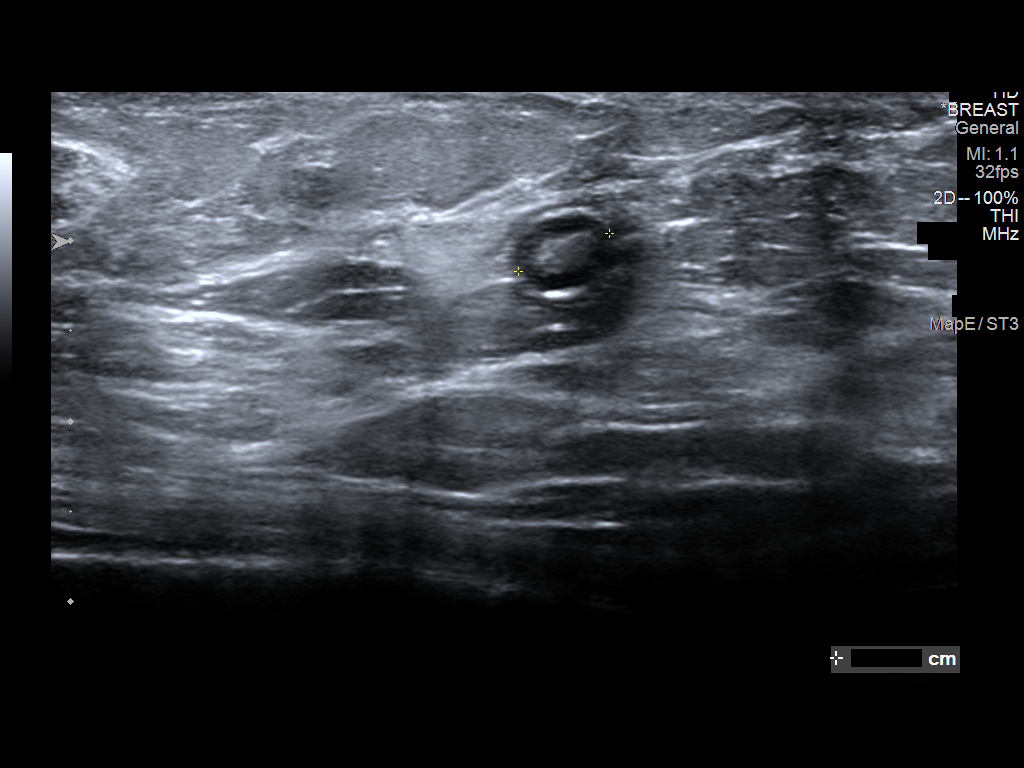

[4 of 4 positions shown; findings below may reference images not displayed]

ACR Breast Density Category b: There are scattered areas of
fibroglandular density.
FINDINGS: Full field CC and MLO views of both breasts were obtained.

RIGHT: The partially obscured isodense mass in the UPPER OUTER
QUADRANT at anterior depth is unchanged, measuring approximately 6
mm. There is no associated architectural distortion or suspicious
calcifications.

No new or suspicious findings elsewhere.

Targeted ultrasound is performed, again demonstrating the
circumscribed mixed hypoechoic and isoechoic mass at the 11 o'clock
subareolar location measuring approximately 6 x 4 x 6 mm (previously
6 x 4 x 6 mm on 03/04/2020 and 7 x 4 x 7 mm on 02/28/2019),
demonstrating mixed posterior characteristics.

LEFT: No findings suspicious for malignancy.
IMPRESSION: 1. No mammographic or sonographic evidence of malignancy involving
the RIGHT breast.
2. No mammographic evidence of malignancy involving the LEFT breast.
3. Stable 6 mm mass in the upper-outer subareolar RIGHT breast at
the 11 o'clock location dating back to February 2019, confirming
benignity.

RECOMMENDATION:
Screening mammogram in one year.(Code:SM-[id number])

I have discussed the findings and recommendations with the patient.
If applicable, a reminder letter will be sent to the patient
regarding the next appointment.

BI-RADS CATEGORY  2: Benign.

## 2022-08-11 ENCOUNTER — Encounter: Payer: Self-pay | Admitting: Internal Medicine

## 2022-09-10 ENCOUNTER — Encounter: Payer: Self-pay | Admitting: Internal Medicine

## 2022-09-10 ENCOUNTER — Ambulatory Visit: Payer: BC Managed Care – PPO | Admitting: Sports Medicine

## 2022-09-10 VITALS — BP 118/80 | HR 74 | Ht 69.0 in | Wt 144.0 lb

## 2022-09-10 DIAGNOSIS — M25572 Pain in left ankle and joints of left foot: Secondary | ICD-10-CM | POA: Diagnosis not present

## 2022-09-10 MED ORDER — MELOXICAM 15 MG PO TABS: 15.0000 mg | ORAL_TABLET | Freq: Every day | ORAL | 0 refills | Status: DC

## 2022-09-10 NOTE — Patient Instructions (Signed)
-   Start meloxicam 15 mg daily x2 weeks.  If still having pain after 2 weeks, complete 3rd-week of meloxicam. May use remaining meloxicam as needed once daily for pain control.  Do not to use additional NSAIDs while taking meloxicam.  May use Tylenol [phone] mg 2 to 3 times a day for breakthrough pain. Ankle HEP  Make a follow up for next Wednesday

## 2022-09-10 NOTE — Progress Notes (Signed)
Kristen Wilkerson D.[facility] [address] [place] [address] Phone: [phone]   Assessment and Plan:     1. Left lateral ankle pain -Chronic with exacerbation, initial sports medicine visit - Recurrent flare of ankle pain most consistent with left-sided peroneal tendon strain based on physical exam - Negative [place] foot and ankle rules, so no x-ray at today's visit - Start meloxicam 15 mg daily x2 weeks.  If still having pain after 2 weeks, complete 3rd-week of meloxicam. May use remaining meloxicam as needed once daily for pain control.  Do not to use additional NSAIDs while taking meloxicam.  May use Tylenol [phone] mg 2 to 3 times a day for breakthrough pain. - Start HEP for ankle  Other orders - meloxicam (MOBIC) 15 MG tablet; Take 1 tablet (15 mg total) by mouth daily.    Pertinent previous records reviewed include left ankle x-ray 2022   Follow Up: 1 week for reevaluation.  Patient has a trip to [place] on 09/18/2022, so we want to optimize treatment before that time.  Could consider CSI with ultrasound if no improvement or worsening of symptoms at follow-up visit   Subjective:   I, Kristen Wilkerson, am serving as a Neurosurgeon for Doctor Richardean Sale  Chief Complaint: left foot pain   HPI:  03/27/2021 Patient is unremarkably well with the PRP.  Appears to be approximately 80% better than previous exam.  Discussed with patient icing regimen and home exercises.  Discussed we can start increasing activity as tolerated.  Follow-up 1 more time in 6 to 8 weeks and hopefully fully release patient at that time   Update 05/08/2021 Kristen Wilkerson is a 62 y.o. female coming in with complaint of B foot pain. Patient states that she is doing much better. Discomfort in L heel with standing for prolonged periods but recovers over night. Feels like lump in bottom other than that patient feels like she has no significant discomfort.  09/10/22 Patient  states that for a couple of nights (Monday) she has had swelling and redness. No MOI, this is a different area then when she was seeing Dr. Katrinka Blazing. She is going to be travelling in a couple of weeks. IBU for the pain and that does not help. Pain is located lateral ankle. Pain when she bares weight and the pain goes up the leg.    Relevant Historical Information: None pertinent  Additional pertinent review of systems negative.   Current Outpatient Medications:    meloxicam (MOBIC) 15 MG tablet, Take 1 tablet (15 mg total) by mouth daily., Disp: 30 tablet, Rfl: 0   Objective:     Vitals:   09/10/22 0916  BP: 118/80  Pulse: 74  SpO2: 98%  Weight: 144 lb (65.3 kg)  Height: 5\' 9"  (1.753 m)      Body mass index is 21.27 kg/m.    Physical Exam:    Gen: Appears well, nad, nontoxic and pleasant Psych: Alert and oriented, appropriate mood and affect Neuro: sensation intact, strength is 5/5 with df/pf/inv/ev, muscle tone wnl Skin: no susupicious lesions or rashes  Left ankle:  No deformity, no swelling or effusion TTP peroneal tendons along posterior lateral malleolus and distal NTTP over fibular head, lat mal, medial mal, achilles, navicular, base of 5th, ATFL, CFL, deltoid, calcaneous or midfoot ROM DF 30, PF 45, inv/ev intact but pain with passive eversion and plantarflexion Negative ant drawer, talar tilt, rotation test, squeeze test. Neg thompson No pain  with resisted inversion.  Pain with resisted eversion   Electronically signed by:  Kristen Wilkerson D.[facility] 9:37 AM 09/10/22

## 2022-09-10 NOTE — Patient Instructions (Addendum)

## 2022-09-10 NOTE — Progress Notes (Signed)
Subjective:    Patient ID: Kristen Wilkerson, female    DOB: January 26, 1961, 62 y.o.   MRN: [id number]      HPI Kristen Wilkerson is here for a Physical exam and her chronic medical problems.   Doing well.  No concerns.   Medications and allergies reviewed with patient and updated if appropriate.  Current Outpatient Medications on File Prior to Visit  Medication Sig Dispense Refill   meloxicam (MOBIC) 15 MG tablet Take 1 tablet (15 mg total) by mouth daily. 30 tablet 0   No current facility-administered medications on file prior to visit.    Review of Systems  Constitutional:  Negative for fever.  Eyes:  Negative for visual disturbance.  Respiratory:  Negative for cough, shortness of breath and wheezing.   Cardiovascular:  Negative for chest pain, palpitations and leg swelling.  Gastrointestinal:  Negative for abdominal pain, blood in stool, constipation and diarrhea.       No gerd  Genitourinary:  Negative for dysuria.  Musculoskeletal:  Positive for arthralgias (mild). Negative for back pain.  Skin:  Negative for rash.  Neurological:  Positive for headaches (allergy related). Negative for dizziness and light-headedness.  Psychiatric/Behavioral:  Negative for dysphoric mood. The patient is not nervous/anxious.        Objective:   Vitals:   09/11/22 0802  BP: 118/70  Pulse: (!) 59  Temp: 97.9 F (36.6 C)  SpO2: 99%   Filed Weights   09/11/22 0802  Weight: 143 lb (64.9 kg)   Body mass index is 21.12 kg/m.  BP Readings from Last 3 Encounters:  09/11/22 118/70  09/10/22 118/80  11/17/21 124/73    Wt Readings from Last 3 Encounters:  09/11/22 143 lb (64.9 kg)  09/10/22 144 lb (65.3 kg)  11/17/21 132 lb 12.8 oz (60.2 kg)       Physical Exam Constitutional: She appears well-developed and well-nourished. No distress.  HENT:  Head: Normocephalic and atraumatic.  Right Ear: External ear normal. Normal ear canal and TM Left Ear: External ear normal.  Normal ear canal  and TM Mouth/Throat: Oropharynx is clear and moist.  Eyes: Conjunctivae normal.  Neck: Neck supple. No tracheal deviation present. No thyromegaly present.  No carotid bruit  Cardiovascular: Normal rate, regular rhythm and normal heart sounds.   No murmur heard.  No edema. Pulmonary/Chest: Effort normal and breath sounds normal. No respiratory distress. She has no wheezes. She has no rales.  Breast: deferred   Abdominal: Soft. She exhibits no distension. There is no tenderness.  Lymphadenopathy: She has no cervical adenopathy.  Skin: Skin is warm and dry. She is not diaphoretic.  Psychiatric: She has a normal mood and affect. Her behavior is normal.     Lab Results  Component Value Date   WBC 6.0 09/09/2021   HGB 14.0 09/09/2021   HCT 40.9 09/09/2021   PLT 222.0 09/09/2021   GLUCOSE 94 09/09/2021   CHOL 195 09/09/2021   TRIG 68.0 09/09/2021   HDL 83.80 09/09/2021   LDLCALC 98 09/09/2021   ALT 14 09/09/2021   AST 19 09/09/2021   NA 139 09/09/2021   K 4.1 09/09/2021   CL 105 09/09/2021   CREATININE 0.74 09/09/2021   BUN 13 09/09/2021   CO2 27 09/09/2021   TSH 2.40 09/09/2021         Assessment & Plan:   Physical exam: Screening blood work  ordered Exercise  yoga, walking, hiking Weight  normal Substance abuse  none  Reviewed recommended immunizations.   Health Maintenance  Topic Date Due   PAP SMEAR-Modifier  04/26/2022   COVID-19 Vaccine (4 - 2023-24 season) 09/26/2022 (Originally 11/28/2021)   INFLUENZA VACCINE  10/29/2022   MAMMOGRAM  03/20/2024   DEXA SCAN  03/21/2027   DTaP/Tdap/Td (3 - Td or Tdap) 04/27/2027   Colonoscopy  11/18/2031   Hepatitis C Screening  Completed   HIV Screening  Completed   Zoster Vaccines- Shingrix  Completed   HPV VACCINES  Aged Out          See Problem List for Assessment and Plan of chronic medical problems.

## 2022-09-11 ENCOUNTER — Encounter: Payer: Self-pay | Admitting: Internal Medicine

## 2022-09-11 ENCOUNTER — Ambulatory Visit (INDEPENDENT_AMBULATORY_CARE_PROVIDER_SITE_OTHER): Payer: BC Managed Care – PPO | Admitting: Internal Medicine

## 2022-09-11 VITALS — BP 118/70 | HR 59 | Temp 97.9°F | Ht 69.0 in | Wt 143.0 lb

## 2022-09-11 DIAGNOSIS — J302 Other seasonal allergic rhinitis: Secondary | ICD-10-CM | POA: Diagnosis not present

## 2022-09-11 DIAGNOSIS — Z13220 Encounter for screening for lipoid disorders: Secondary | ICD-10-CM | POA: Diagnosis not present

## 2022-09-11 DIAGNOSIS — Z0000 Encounter for general adult medical examination without abnormal findings: Secondary | ICD-10-CM

## 2022-09-11 DIAGNOSIS — Z0001 Encounter for general adult medical examination with abnormal findings: Secondary | ICD-10-CM | POA: Diagnosis not present

## 2022-09-11 LAB — COMPREHENSIVE METABOLIC PANEL
ALT: 14 U/L (ref 0–35)
AST: 17 U/L (ref 0–37)
Albumin: 4.6 g/dL (ref 3.5–5.2)
Alkaline Phosphatase: 99 U/L (ref 39–117)
BUN: 15 mg/dL (ref 6–23)
CO2: 27 mEq/L (ref 19–32)
Calcium: 9.7 mg/dL (ref 8.4–10.5)
Chloride: 103 mEq/L (ref 96–112)
Creatinine, Ser: 0.75 mg/dL (ref 0.40–1.20)
GFR: 86.1 mL/min (ref >60.00)
Glucose, Bld: 94 mg/dL (ref 70–99)
Potassium: 4.6 mEq/L (ref 3.5–5.1)
Sodium: 139 mEq/L (ref 135–145)
Total Bilirubin: 1.2 mg/dL (ref 0.2–1.2)
Total Protein: 7.5 g/dL (ref 6.0–8.3)

## 2022-09-11 LAB — LIPID PANEL
Cholesterol: 192 mg/dL (ref 0–200)
HDL: 81 mg/dL (ref >39.00)
LDL Cholesterol: 97 mg/dL (ref 0–99)
NonHDL: 111.03
Total CHOL/HDL Ratio: 2
Triglycerides: 71 mg/dL (ref 0.0–149.0)
VLDL: 14.2 mg/dL (ref 0.0–40.0)

## 2022-09-11 LAB — CBC WITH DIFFERENTIAL/PLATELET
Basophils Absolute: 0 10*3/uL (ref 0.0–0.1)
Basophils Relative: 0.5 % (ref 0.0–3.0)
Eosinophils Absolute: 0.2 10*3/uL (ref 0.0–0.7)
Eosinophils Relative: 2.2 % (ref 0.0–5.0)
HCT: 42.9 % (ref 36.0–46.0)
Hemoglobin: 14.5 g/dL (ref 12.0–15.0)
Lymphocytes Relative: 18 % (ref 12.0–46.0)
Lymphs Abs: 1.6 10*3/uL (ref 0.7–4.0)
MCHC: 33.7 g/dL (ref 30.0–36.0)
MCV: 93.9 fl (ref 78.0–100.0)
Monocytes Absolute: 0.7 10*3/uL (ref 0.1–1.0)
Monocytes Relative: 8.4 % (ref 3.0–12.0)
Neutro Abs: 6.3 10*3/uL (ref 1.4–7.7)
Neutrophils Relative %: 70.9 % (ref 43.0–77.0)
Platelets: 239 10*3/uL (ref 150.0–400.0)
RBC: 4.57 Mil/uL (ref 3.87–5.11)
RDW: 13.2 % (ref 11.5–15.5)
WBC: 8.8 10*3/uL (ref 4.0–10.5)

## 2022-09-11 LAB — TSH: TSH: 2.23 u[IU]/mL (ref 0.35–5.50)

## 2022-09-11 NOTE — Assessment & Plan Note (Signed)
Chronic Controlled, stable Continue loratadine 10 mg daily otc prn

## 2022-09-15 NOTE — Progress Notes (Unsigned)
    Aleen Sells D.[facility] [address] [place] [address] Phone: [phone]   Assessment and Plan:     There are no diagnoses linked to this encounter.  ***   Pertinent previous records reviewed include ***   Follow Up: ***     Subjective:   I, Schon Zeiders, am serving as a Neurosurgeon for Doctor Richardean Sale   Chief Complaint: left foot pain    HPI:  03/27/2021 Patient is unremarkably well with the PRP.  Appears to be approximately 80% better than previous exam.  Discussed with patient icing regimen and home exercises.  Discussed we can start increasing activity as tolerated.  Follow-up 1 more time in 6 to 8 weeks and hopefully fully release patient at that time   Update 05/08/2021 CAPRIANA OTTINGER is a 62 y.o. female coming in with complaint of B foot pain. Patient states that she is doing much better. Discomfort in L heel with standing for prolonged periods but recovers over night. Feels like lump in bottom other than that patient feels like she has no significant discomfort.   09/10/22 Patient states that for a couple of nights (Monday) she has had swelling and redness. No MOI, this is a different area then when she was seeing Dr. Katrinka Blazing. She is going to be travelling in a couple of weeks. IBU for the pain and that does not help. Pain is located lateral ankle. Pain when she bares weight and the pain goes up the leg.    09/16/2022 Patient states   Relevant Historical Information: None pertinent  Additional pertinent review of systems negative.   Current Outpatient Medications:    meloxicam (MOBIC) 15 MG tablet, Take 1 tablet (15 mg total) by mouth daily., Disp: 30 tablet, Rfl: 0   Objective:     There were no vitals filed for this visit.    There is no height or weight on file to calculate BMI.    Physical Exam:    ***   Electronically signed by:  Aleen Sells D.[facility] 9:40 AM 09/15/22

## 2022-09-16 ENCOUNTER — Ambulatory Visit (INDEPENDENT_AMBULATORY_CARE_PROVIDER_SITE_OTHER): Payer: BC Managed Care – PPO | Admitting: Sports Medicine

## 2022-09-16 VITALS — BP 110/72 | Ht 69.0 in | Wt 147.0 lb

## 2022-09-16 DIAGNOSIS — M25572 Pain in left ankle and joints of left foot: Secondary | ICD-10-CM | POA: Diagnosis not present

## 2022-12-08 NOTE — Progress Notes (Unsigned)
[facility] [address] [place] [address] Phone: [phone] Subjective:   Kristen Wilkerson, am serving as a scribe for Dr. Antoine Primas.  I'm seeing this patient by the request  of:  [name], MD  CC: Right hip pain  [id number]  Kristen Wilkerson is a 62 y.o. female coming in with complaint of R foot pain. Last seen in February for plantar fascitis. Patient states started in July and has been getting progressively worse. Nothing is helping with pain. Stopping her from doing activity.      Past Medical History:  Diagnosis Date   Allergic rhinitis, cause unspecified    Claritin, Flonase seasonally   Exercise-induced asthma    related to weather changes/allergies; had normal stress echo for evaluation of DOE   Palpitations    many years ago   Past Surgical History:  Procedure Laterality Date   BREAST BIOPSY Right 2010   THYROID CYST EXCISION  2017   TONSILLECTOMY  1985   uterine cyst removal     Social History   Socioeconomic History   Marital status: Single    Spouse name: Not on file   Number of children: 0   Years of education: Not on file   Highest education level: Not on file  Occupational History   Occupation: management (product development)    Employer: [employer]  Tobacco Use   Smoking status: Never   Smokeless tobacco: Never  Vaping Use   Vaping status: Never Used  Substance and Sexual Activity   Alcohol use: Yes    Alcohol/week: 4.0 standard drinks of alcohol    Types: 4 Glasses of wine per week    Comment: 3-4 drinks per week.   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      Tobacco: none       Alcohol: 3-5 alcoholic beverages per week      Exercise: hike, walking, biking, running, yoga; daily exercise; gym membership      Seatbelt: 100%      Sexual activity: in 2019, no sexual activity since 2014.     Social Determinants of Health   Financial Resource Strain: Low Risk  (02/10/2019)   Overall Financial Resource Strain (CARDIA)    Difficulty of Paying Living Expenses: Not hard at all  Food Insecurity: No Food Insecurity (02/10/2019)   Hunger Vital Sign    Worried About Running Out of Food in the Last Year: Never true    Ran Out of Food in the Last Year: Never true  Transportation Needs: No Transportation Needs (02/10/2019)   PRAPARE - Administrator, Civil Service (Medical): No    Lack of Transportation (Non-Medical): No  Physical Activity: Sufficiently Active (04/28/2017)   Exercise Vital Sign    Days of Exercise per Week: 5 days    Minutes of Exercise per Session: 30 min  Stress: No Stress Concern Present (02/10/2019)   [employer] of Occupational Health - Occupational Stress Questionnaire    Feeling of Stress : Only a little  Social Connections: Unknown (08/08/2021)   [facility]   Social Network    Social Network: Not on file   No Known Allergies Family History  Problem Relation Age of Onset   Hypertension Mother    Hyperlipidemia Mother    Stroke Father 29   Hypertension Father    Heart disease Father        CHF   Parkinsonism Father    Hypertension Sister    Hypertension Sister    Hypertension Brother    Migraines Brother    Diabetes Maternal Uncle    Parkinsonism Maternal Uncle    Diabetes Maternal Grandmother    Heart disease Maternal Grandmother    Stroke Maternal Grandmother    Diabetes Maternal Grandfather    Stroke Maternal Grandfather 52   Heart disease Paternal Grandmother    Colon cancer Neg Hx    Colon polyps Neg Hx    Esophageal cancer Neg Hx    Rectal cancer Neg Hx    Stomach cancer Neg Hx         Current Outpatient Medications (Analgesics):    meloxicam (MOBIC) 15 MG tablet, Take 1 tablet (15 mg total) by mouth daily.   Current Outpatient Medications (Other):    Vitamin D, Ergocalciferol, (DRISDOL) 1.25 MG (50000 UNIT) CAPS capsule, Take 1 capsule (50,000 Units total) by mouth every 7 (seven) days.    Reviewed prior external information including notes and imaging from  primary care provider As well as notes that were available from care everywhere and other healthcare systems.  Past medical history, social, surgical and family history all reviewed in electronic medical record.  No pertanent information unless stated regarding to the chief complaint.   Review of Systems:  No headache, visual changes, nausea, vomiting, diarrhea, constipation, dizziness, abdominal pain, skin rash, fevers, chills, night sweats, weight loss, swollen lymph nodes, body aches, joint swelling, chest pain, shortness of breath, mood changes. POSITIVE muscle aches  Objective  Blood pressure 112/74, pulse 69, height 5\' 9"  (1.753 m), weight 147 lb (66.7 kg), last menstrual period 12/11/2010, SpO2 99%.   General: No apparent distress alert and oriented x3 mood and affect normal, dressed appropriately.  HEENT: Pupils equal, extraocular movements intact  Respiratory: Patient's speak in full sentences and does not appear short of breath  Cardiovascular: No lower extremity edema, non tender, no erythema  Right foot exam shows the patient does have pain in the calcaneal area.  Patient does have tenderness to palpation noted in this area.  Tightness noted in the posterior capsule.  Neurovascularly intact distally.  Limited muscular skeletal ultrasound was performed and interpreted by Antoine Primas, M  Limited ultrasound does show the patient does have some hypoechoic changes in the fat pad that is consistent with some inflammation.  Patient does have a calcaneal heel spur noted on the inferior aspect of  the calcaneal area.  Thickening of the posterior capsule Impression: Calcaneal heel spur with fat pad irritation    Impression and Recommendations:     The above documentation has been reviewed and is accurate and complete Judi Saa, DO

## 2022-12-09 ENCOUNTER — Ambulatory Visit (INDEPENDENT_AMBULATORY_CARE_PROVIDER_SITE_OTHER): Payer: BC Managed Care – PPO | Admitting: Family Medicine

## 2022-12-09 ENCOUNTER — Encounter: Payer: Self-pay | Admitting: Family Medicine

## 2022-12-09 ENCOUNTER — Other Ambulatory Visit: Payer: Self-pay

## 2022-12-09 VITALS — BP 112/74 | HR 69 | Ht 69.0 in | Wt 147.0 lb

## 2022-12-09 DIAGNOSIS — M79671 Pain in right foot: Secondary | ICD-10-CM | POA: Diagnosis not present

## 2022-12-09 DIAGNOSIS — M79672 Pain in left foot: Secondary | ICD-10-CM

## 2022-12-09 DIAGNOSIS — M722 Plantar fascial fibromatosis: Secondary | ICD-10-CM

## 2022-12-09 MED ORDER — VITAMIN D (ERGOCALCIFEROL) 1.25 MG (50000 UNIT) PO CAPS: 50000.0000 [IU] | ORAL_CAPSULE | ORAL | 0 refills | Status: DC

## 2022-12-09 NOTE — Assessment & Plan Note (Signed)
Seems to be multifactorial.  Patient does have what appears to be a calcaneal heel spur that could be contributing as well as causing some fat pad irritation and does have thickening of the posterior capsule of the ankle that is concerning for an early frozen ankle.  We discussed with patient about home exercises, once weekly vitamin D, shockwave therapy to break up some calcific changes.  Increase activity slowly otherwise.  Follow-up with me again in 6 to 8 weeks otherwise.

## 2022-12-09 NOTE — Patient Instructions (Addendum)
You have 14 days to return or exchange your brace Call [phone], then return the brace to our office Vit D weekly Do prescribed exercises at least 3x a week  Shockwave for 3 weeks See you again in 4-6 weeks

## 2022-12-15 ENCOUNTER — Ambulatory Visit: Payer: BC Managed Care – PPO | Admitting: Family Medicine

## 2022-12-16 ENCOUNTER — Encounter: Payer: Self-pay | Admitting: Family Medicine

## 2022-12-16 ENCOUNTER — Ambulatory Visit (INDEPENDENT_AMBULATORY_CARE_PROVIDER_SITE_OTHER): Payer: BC Managed Care – PPO | Admitting: Family Medicine

## 2022-12-16 VITALS — BP 102/76 | HR 75 | Ht 69.0 in | Wt 147.0 lb

## 2022-12-16 DIAGNOSIS — M722 Plantar fascial fibromatosis: Secondary | ICD-10-CM

## 2022-12-16 NOTE — Patient Instructions (Addendum)
Thank you for coming in today.   Schedule your 2nd Shockwave treatment for next week  Typically schedule a total of 3 more session.   Try a night splint for plantar fasciits

## 2022-12-16 NOTE — Progress Notes (Signed)
   Rubin Payor, PhD, LAT, ATC acting as a scribe for Clementeen Graham, MD.  Kristen Wilkerson is a 62 y.o. female who presents to [facility] today for consultation on ECSWT for her R foot pain. Pain has been worsening since July. Pt was previously seen by Dr. Katrinka Blazing on 12/09/22. Pt locates pain to the plantar aspect of her R heel. She notes a bone spur and inflammation of her Achilles. She is unable to do yoga and has had to decrease her walking.   Aggravates: varies Treatments tried: brace, IBU, Vit D & E  Dx imaging: 01/25/21 L ankle MRI  10/31/20 R & L ankle XR  Pertinent review of systems: No fevers or chills  Relevant historical information: History contralateral left plantar fascia improved with PRP injection.   Exam:  BP 102/76   Pulse 75   Ht 5\' 9"  (1.753 m)   Wt 147 lb (66.7 kg)   LMP 12/11/2010   SpO2 98%   BMI 21.71 kg/m  General: Well Developed, well nourished, and in no acute distress.   MSK: Right foot some prominence at the posterior calcaneus.  This is nontender to palpation.  Patient has tenderness to palpation along the medial plantar calcaneus along the origin of the plantar fascia.  Normal foot and ankle motion.  Intact strength.    Lab and Radiology Results                Extracorporeal Shockwave Therapy Note    Patient is being treated today with ECSWT. Informed consent was obtained and patient tolerated procedure well.   Therapy performed by Clementeen Graham  Condition treated: Plantar fasciitis right Treatment preset used: Heel spur Energy used: 120 mJ Frequency used: 13 Hz Number of pulses: 2000 Head Size: Medium Treatment #1 of #4        Assessment and Plan: 61 y.o. female with right plantar fasciitis with some mild Achilles tendinitis.  Plan to treat with shockwave today predominantly at the plantar fascia.  She tolerated the initial shockwave quite well we can schedule sessions 2-4. Maximize conservative  management including eccentric exercises icing and night splints.  Return in 1 week for shockwave session #2.  No charge for today shockwave session.  Billing is for the consultation portion of the visit. Total encounter time 20 minutes including face-to-face time with the patient and, reviewing past medical record, and charting on the date of service.     PDMP not reviewed this encounter. No orders of the defined types were placed in this encounter.  No orders of the defined types were placed in this encounter.    Discussed warning signs or symptoms. Please see discharge instructions. Patient expresses understanding.   The above documentation has been reviewed and is accurate and complete Clementeen Graham, M.D.

## 2022-12-17 ENCOUNTER — Other Ambulatory Visit: Payer: Self-pay

## 2022-12-17 DIAGNOSIS — M25571 Pain in right ankle and joints of right foot: Secondary | ICD-10-CM

## 2022-12-18 ENCOUNTER — Ambulatory Visit
Admission: RE | Admit: 2022-12-18 | Discharge: 2022-12-18 | Disposition: A | Discharge status: Home / Self Care | Payer: BC Managed Care – PPO | Source: Ambulatory Visit | Attending: Family Medicine | Admitting: Family Medicine

## 2022-12-18 DIAGNOSIS — M25571 Pain in right ankle and joints of right foot: Secondary | ICD-10-CM | POA: Diagnosis not present

## 2022-12-18 DIAGNOSIS — R600 Localized edema: Secondary | ICD-10-CM | POA: Diagnosis not present

## 2022-12-18 DIAGNOSIS — S93491A Sprain of other ligament of right ankle, initial encounter: Secondary | ICD-10-CM | POA: Diagnosis not present

## 2022-12-18 DIAGNOSIS — M722 Plantar fascial fibromatosis: Secondary | ICD-10-CM | POA: Diagnosis not present

## 2022-12-23 ENCOUNTER — Ambulatory Visit: Payer: BC Managed Care – PPO | Admitting: Family Medicine

## 2022-12-30 ENCOUNTER — Ambulatory Visit: Payer: BC Managed Care – PPO | Admitting: Family Medicine

## 2023-01-01 ENCOUNTER — Other Ambulatory Visit: Payer: Self-pay

## 2023-01-01 DIAGNOSIS — M25571 Pain in right ankle and joints of right foot: Secondary | ICD-10-CM

## 2023-01-11 DIAGNOSIS — M722 Plantar fascial fibromatosis: Secondary | ICD-10-CM | POA: Diagnosis not present

## 2023-01-13 ENCOUNTER — Ambulatory Visit: Payer: BC Managed Care – PPO | Admitting: Family Medicine

## 2023-01-21 ENCOUNTER — Encounter: Payer: Self-pay | Admitting: Dermatology

## 2023-01-21 ENCOUNTER — Ambulatory Visit: Payer: BC Managed Care – PPO | Admitting: Dermatology

## 2023-01-21 VITALS — BP 111/74 | HR 71

## 2023-01-21 DIAGNOSIS — W908XXA Exposure to other nonionizing radiation, initial encounter: Secondary | ICD-10-CM | POA: Diagnosis not present

## 2023-01-21 DIAGNOSIS — D1801 Hemangioma of skin and subcutaneous tissue: Secondary | ICD-10-CM | POA: Diagnosis not present

## 2023-01-21 DIAGNOSIS — D492 Neoplasm of unspecified behavior of bone, soft tissue, and skin: Secondary | ICD-10-CM

## 2023-01-21 DIAGNOSIS — L570 Actinic keratosis: Secondary | ICD-10-CM | POA: Diagnosis not present

## 2023-01-21 DIAGNOSIS — L821 Other seborrheic keratosis: Secondary | ICD-10-CM | POA: Diagnosis not present

## 2023-01-21 DIAGNOSIS — D2271 Melanocytic nevi of right lower limb, including hip: Secondary | ICD-10-CM | POA: Diagnosis not present

## 2023-01-21 DIAGNOSIS — L578 Other skin changes due to chronic exposure to nonionizing radiation: Secondary | ICD-10-CM

## 2023-01-21 DIAGNOSIS — D489 Neoplasm of uncertain behavior, unspecified: Secondary | ICD-10-CM

## 2023-01-21 DIAGNOSIS — Z1283 Encounter for screening for malignant neoplasm of skin: Secondary | ICD-10-CM | POA: Diagnosis not present

## 2023-01-21 DIAGNOSIS — L814 Other melanin hyperpigmentation: Secondary | ICD-10-CM | POA: Diagnosis not present

## 2023-01-21 DIAGNOSIS — Z808 Family history of malignant neoplasm of other organs or systems: Secondary | ICD-10-CM

## 2023-01-21 DIAGNOSIS — D229 Melanocytic nevi, unspecified: Secondary | ICD-10-CM

## 2023-01-21 NOTE — Patient Instructions (Addendum)

## 2023-01-21 NOTE — Progress Notes (Signed)
New Patient Visit   Subjective  Kristen B Hymon is a 62 y.o. female who presents for the following: TBSE  Patient states she does not have any areas of concern today. w Patient reports she has previously by dermatologist over 10 years ago. Patient has Hx of about 10 Bx but none resulted with abnormalities. Pt mentioned that she had a lot of sun exposure in her youth. Patient does family history of skin cancer(s). Patients mother(basal cell and melanoma) and father (basal cell) has history of skin cancer.    The following portions of the chart were reviewed this encounter and updated as appropriate: medications, allergies, medical history  Review of Systems:  No other skin or systemic complaints except as noted in HPI or Assessment and Plan.  Objective  Well appearing patient in no apparent distress; mood and affect are within normal limits.  A full examination was performed including scalp, head, eyes, ears, nose, lips, neck, chest, axillae, abdomen, back, buttocks, bilateral upper extremities, bilateral lower extremities, hands, feet, fingers, toes, fingernails, and toenails. All findings within normal limits unless otherwise noted below.  Relevant exam findings are noted in the Assessment and Plan.             Assessment & Plan   Neoplasm of uncertain behavior (2) Right Thigh - Anterior  Skin / nail biopsy Type of biopsy: tangential   Informed consent: discussed and consent obtained   Timeout: patient name, date of birth, surgical site, and procedure verified   Procedure prep:  Patient was prepped and draped in usual sterile fashion Prep type:  Isopropyl alcohol Anesthesia: the lesion was anesthetized in a standard fashion   Anesthetic:  1% lidocaine w/ epinephrine 1-100,000 buffered w/ 8.4% NaHCO3 Instrument used: DermaBlade   Hemostasis achieved with: aluminum chloride   Outcome: patient tolerated procedure well   Post-procedure details: sterile dressing applied and  wound care instructions given    Specimen 1 - Surgical pathology Differential Diagnosis: Rule out DN  Check Margins: Yes  Left Buttock  Skin / nail biopsy Type of biopsy: tangential   Informed consent: discussed and consent obtained   Timeout: patient name, date of birth, surgical site, and procedure verified   Procedure prep:  Patient was prepped and draped in usual sterile fashion Prep type:  Isopropyl alcohol Anesthesia: the lesion was anesthetized in a standard fashion   Anesthetic:  1% lidocaine w/ epinephrine 1-100,000 buffered w/ 8.4% NaHCO3 Instrument used: DermaBlade   Hemostasis achieved with: aluminum chloride   Outcome: patient tolerated procedure well   Post-procedure details: sterile dressing applied and wound care instructions given    Specimen 2 - Surgical pathology Differential Diagnosis: Rule out DN  Check Margins: Yes  AK (actinic keratosis) Right Zygomatic Area  Destruction of lesion - Right Zygomatic Area Complexity: simple   Destruction method: cryotherapy   Informed consent: discussed and consent obtained   Timeout:  patient name, date of birth, surgical site, and procedure verified Lesion destroyed using liquid nitrogen: Yes   Region frozen until ice ball extended beyond lesion: Yes   Outcome: patient tolerated procedure well with no complications   Post-procedure details: wound care instructions given       Assessment & Plan   LENTIGINES, SEBORRHEIC KERATOSES, HEMANGIOMAS - Benign normal skin lesions - Benign-appearing - Call for any changes  BENIGN MELANOCYTIC NEVI - Tan-brown and/or pink-flesh-colored symmetric macules and papules - Benign appearing on exam today - Observation - Call clinic for new or changing moles -  Recommend daily use of broad spectrum spf 30+ sunscreen to sun-exposed areas.   ACTINIC DAMAGE - Chronic condition, secondary to cumulative UV/sun exposure - diffuse scaly erythematous macules with underlying  dyspigmentation - Recommend daily broad spectrum sunscreen SPF 30+ to sun-exposed areas, reapply every 2 hours as needed.  - Staying in the shade or wearing long sleeves, sun glasses (UVA+UVB protection) and wide brim hats (4-inch brim around the entire circumference of the hat) are also recommended for sun protection.  - Call for new or changing lesions.  SKIN CANCER SCREENING PERFORMED TODAY    No follow-ups on file.    Documentation: I have reviewed the above documentation for accuracy and completeness, and I agree with the above.  I, Shirron Marcha Solders, CMA, am acting as scribe for [employer], DO.   Langston Reusing, DO

## 2023-01-25 LAB — SURGICAL PATHOLOGY

## 2023-01-26 NOTE — Progress Notes (Signed)
Hi Kristen Wilkerson  Dr. Onalee Hua reviewed your biopsy results and they showed the spot removed from the buttock was benign (not cancerous) and the other removed from the right thigh was a little abnormal but the margins were clear.  No additional treatment is required.  The detailed report is available to view in MyChart.  Have a great day!  Kind Regards,  Dr. Kermit Balo Care Team

## 2023-03-31 ENCOUNTER — Other Ambulatory Visit: Payer: Self-pay | Admitting: Medical Genetics

## 2023-04-05 ENCOUNTER — Other Ambulatory Visit (HOSPITAL_COMMUNITY)
Admission: RE | Admit: 2023-04-05 | Discharge: 2023-04-05 | Disposition: A | Discharge status: Home / Self Care | Payer: Self-pay | Source: Ambulatory Visit | Attending: Oncology | Admitting: Oncology

## 2023-04-19 LAB — GENECONNECT MOLECULAR SCREEN: Genetic Analysis Overall Interpretation: NEGATIVE

## 2023-12-28 NOTE — Patient Instructions (Addendum)
 Pneumonia vaccine given.   Blood work was ordered.       Medications changes include :   None    [facility] Phone : [phone] Landy Stains   A referral was ordered for rheumatology ordered.    Return in about 1 year (around 12/28/2024) for Physical Exam.   Health Maintenance, Female Adopting a healthy lifestyle and getting preventive care are important in promoting health and wellness. Ask your health care provider about: The right schedule for you to have regular tests and exams. Things you can do on your own to prevent diseases and keep yourself healthy. What should I know about diet, weight, and exercise? Eat a healthy diet  Eat a diet that includes plenty of vegetables, fruits, low-fat dairy products, and lean protein. Do not eat a lot of foods that are high in solid fats, added sugars, or sodium. Maintain a healthy weight Body mass index (BMI) is used to identify weight problems. It estimates body fat based on height and weight. Your health care provider can help determine your BMI and help you achieve or maintain a healthy weight. Get regular exercise Get regular exercise. This is one of the most important things you can do for your health. Most adults should: Exercise for at least 150 minutes each week. The exercise should increase your heart rate and make you sweat (moderate-intensity exercise). Do strengthening exercises at least twice a week. This is in addition to the moderate-intensity exercise. Spend less time sitting. Even light physical activity can be beneficial. Watch cholesterol and blood lipids Have your blood tested for lipids and cholesterol at 62 years of age, then have this test every 5 years. Have your cholesterol levels checked more often if: Your lipid or cholesterol levels are high. You are older than 62 years of age. You are at high risk for heart disease. What should I know about cancer  screening? Depending on your health history and family history, you may need to have cancer screening at various ages. This may include screening for: Breast cancer. Cervical cancer. Colorectal cancer. Skin cancer. Lung cancer. What should I know about heart disease, diabetes, and high blood pressure? Blood pressure and heart disease High blood pressure causes heart disease and increases the risk of stroke. This is more likely to develop in people who have high blood pressure readings or are overweight. Have your blood pressure checked: Every 3-5 years if you are 20-68 years of age. Every year if you are 72 years old or older. Diabetes Have regular diabetes screenings. This checks your fasting blood sugar level. Have the screening done: Once every three years after age 16 if you are at a normal weight and have a low risk for diabetes. More often and at a younger age if you are overweight or have a high risk for diabetes. What should I know about preventing infection? Hepatitis B If you have a higher risk for hepatitis B, you should be screened for this virus. Talk with your health care provider to find out if you are at risk for hepatitis B infection. Hepatitis C Testing is recommended for: Everyone born from 1 through 1965. Anyone with known risk factors for hepatitis C. Sexually transmitted infections (STIs) Get screened for STIs, including gonorrhea and chlamydia, if: You are sexually active and are younger than 62 years of age. You are older than 62 years of age and your health care provider tells you that you are at risk for  this type of infection. Your sexual activity has changed since you were last screened, and you are at increased risk for chlamydia or gonorrhea. Ask your health care provider if you are at risk. Ask your health care provider about whether you are at high risk for HIV. Your health care provider may recommend a prescription medicine to help prevent HIV  infection. If you choose to take medicine to prevent HIV, you should first get tested for HIV. You should then be tested every 3 months for as long as you are taking the medicine. Pregnancy If you are about to stop having your period (premenopausal) and you may become pregnant, seek counseling before you get pregnant. Take 400 to 800 micrograms (mcg) of folic acid every day if you become pregnant. Ask for birth control (contraception) if you want to prevent pregnancy. Osteoporosis and menopause Osteoporosis is a disease in which the bones lose minerals and strength with aging. This can result in bone fractures. If you are 86 years old or older, or if you are at risk for osteoporosis and fractures, ask your health care provider if you should: Be screened for bone loss. Take a calcium or vitamin D  supplement to lower your risk of fractures. Be given hormone replacement therapy (HRT) to treat symptoms of menopause. Follow these instructions at home: Alcohol use Do not drink alcohol if: Your health care provider tells you not to drink. You are pregnant, may be pregnant, or are planning to become pregnant. If you drink alcohol: Limit how much you have to: 0-1 drink a day. Know how much alcohol is in your drink. In the [place] one drink equals one 12 oz bottle of beer (355 mL), one 5 oz glass of wine (148 mL), or one 1 oz glass of hard liquor (44 mL). Lifestyle Do not use any products that contain nicotine or tobacco. These products include cigarettes, chewing tobacco, and vaping devices, such as e-cigarettes. If you need help quitting, ask your health care provider. Do not use street drugs. Do not share needles. Ask your health care provider for help if you need support or information about quitting drugs. General instructions Schedule regular health, dental, and eye exams. Stay current with your vaccines. Tell your health care provider if: You often feel depressed. You have ever been abused  or do not feel safe at home. Summary Adopting a healthy lifestyle and getting preventive care are important in promoting health and wellness. Follow your health care provider's instructions about healthy diet, exercising, and getting tested or screened for diseases. Follow your health care provider's instructions on monitoring your cholesterol and blood pressure. This information is not intended to replace advice given to you by your health care provider. Make sure you discuss any questions you have with your health care provider. Document Revised: 08/05/2020 Document Reviewed: 08/05/2020 Elsevier Patient Education  2024 [employer].

## 2023-12-28 NOTE — Progress Notes (Unsigned)
 Subjective:    Patient ID: Kristen Wilkerson, female    DOB: [birth year], 63 y.o.   MRN: [id number]      HPI Kristen Wilkerson is here for a Physical exam and her chronic medical problems.   Wants to discuss psoriatic arthritis ( 20-30%)    Medications and allergies reviewed with patient and updated if appropriate.  Current Outpatient Medications on File Prior to Visit  Medication Sig Dispense Refill   Vitamin D , Ergocalciferol , (DRISDOL ) 1.25 MG (50000 UNIT) CAPS capsule Take 1 capsule (50,000 Units total) by mouth every 7 (seven) days. 12 capsule 0   No current facility-administered medications on file prior to visit.    Review of Systems     Objective:  There were no vitals filed for this visit. There were no vitals filed for this visit. There is no height or weight on file to calculate BMI.  BP Readings from Last 3 Encounters:  01/21/23 111/74  12/16/22 102/76  12/09/22 112/74    Wt Readings from Last 3 Encounters:  12/16/22 147 lb (66.7 kg)  12/09/22 147 lb (66.7 kg)  09/16/22 147 lb (66.7 kg)       Physical Exam Constitutional: She appears well-developed and well-nourished. No distress.  HENT:  Head: Normocephalic and atraumatic.  Right Ear: External ear normal. Normal ear canal and TM Left Ear: External ear normal.  Normal ear canal and TM Mouth/Throat: Oropharynx is clear and moist.  Eyes: Conjunctivae normal.  Neck: Neck supple. No tracheal deviation present. No thyromegaly present.  No carotid bruit  Cardiovascular: Normal rate, regular rhythm and normal heart sounds.   No murmur heard.  No edema. Pulmonary/Chest: Effort normal and breath sounds normal. No respiratory distress. She has no wheezes. She has no rales.  Breast: deferred   Abdominal: Soft. She exhibits no distension. There is no tenderness.  Lymphadenopathy: She has no cervical adenopathy.  Skin: Skin is warm and dry. She is not diaphoretic.  Psychiatric: She has a normal mood and affect. Her  behavior is normal.     Lab Results  Component Value Date   WBC 8.8 09/11/2022   HGB 14.5 09/11/2022   HCT 42.9 09/11/2022   PLT 239.0 09/11/2022   GLUCOSE 94 09/11/2022   CHOL 192 09/11/2022   TRIG 71.0 09/11/2022   HDL 81.00 09/11/2022   LDLCALC 97 09/11/2022   ALT 14 09/11/2022   AST 17 09/11/2022   NA 139 09/11/2022   K 4.6 09/11/2022   CL 103 09/11/2022   CREATININE 0.75 09/11/2022   BUN 15 09/11/2022   CO2 27 09/11/2022   TSH 2.23 09/11/2022         Assessment & Plan:   Physical exam: Screening blood work  ordered Exercise   Weight   Substance abuse  none   Reviewed recommended immunizations.   Health Maintenance  Topic Date Due   Pneumococcal Vaccine: 50+ Years (1 of 1 - PCV) Never done   Cervical Cancer Screening (HPV/Pap Cotest)  04/26/2022   Influenza Vaccine  10/29/2023   COVID-19 Vaccine (4 - 2025-26 season) 11/29/2023   Mammogram  03/20/2024   DEXA SCAN  03/21/2027   DTaP/Tdap/Td (3 - Td or Tdap) 04/27/2027   Colonoscopy  11/18/2031   Hepatitis C Screening  Completed   HIV Screening  Completed   Zoster Vaccines- Shingrix  Completed   Hepatitis B Vaccines 19-59 Average Risk  Aged Out   HPV VACCINES  Aged Out   Meningococcal B Vaccine  Aged  Out          See Problem List for Assessment and Plan of chronic medical problems.

## 2023-12-29 ENCOUNTER — Ambulatory Visit (INDEPENDENT_AMBULATORY_CARE_PROVIDER_SITE_OTHER): Admitting: Internal Medicine

## 2023-12-29 VITALS — BP 112/72 | HR 74 | Temp 98.4°F | Ht 69.0 in | Wt 155.0 lb

## 2023-12-29 DIAGNOSIS — Z13220 Encounter for screening for lipoid disorders: Secondary | ICD-10-CM | POA: Diagnosis not present

## 2023-12-29 DIAGNOSIS — Z0000 Encounter for general adult medical examination without abnormal findings: Secondary | ICD-10-CM | POA: Diagnosis not present

## 2023-12-29 DIAGNOSIS — Z23 Encounter for immunization: Secondary | ICD-10-CM

## 2023-12-29 DIAGNOSIS — M79645 Pain in left finger(s): Secondary | ICD-10-CM | POA: Diagnosis not present

## 2023-12-29 DIAGNOSIS — M2550 Pain in unspecified joint: Secondary | ICD-10-CM | POA: Diagnosis not present

## 2023-12-29 NOTE — Assessment & Plan Note (Signed)
 Chronic Over the years since had chronic left thumb pain and intermittent joint pain in her hands and feet and other places-nothing consistent At 1 point she had severe left elbow pain and was not able to extend her elbow and that lasted weeks She is also had some rashes She has a history of plantar fasciitis She is concerned about the possibility of psoriatic arthritis-has an aunt that has it Will check ESR, CRP, ANA, CCP, RF, ESR, CRP and HLA-B27 Referral to rheumatology since blood work could be negative and she still have psoriatic arthritis.

## 2023-12-29 NOTE — Progress Notes (Unsigned)
 Office Visit Note  Patient: Kristen Wilkerson             Date of Birth: 26-Jan-1961           MRN: [id number]             PCP: Geofm Glade PARAS, MD Referring: Geofm Glade PARAS, MD Visit Date: 12/31/2023 Occupation: Data Unavailable  Subjective:  Pain in multiple joints  History of Present Illness: Kristen Wilkerson is a 63 y.o. female seen for the evaluation of polyarthralgia.  According the patient her symptoms started about 3 to 4 years ago with pain in both feet and her both hands.  She states she was diagnosed with plantar fasciitis which lingered on for almost 2-1/2 years.  She was under care of Dr. Wylie and tried bilateral braces, nitroglycerin  patch and exercises without much help.  She states eventually she had PRP injections which helped after 4 months.  Her symptoms went into remission and then recurred 1-1/2 years later.  She stated at that time Dr. Claudene felt that her symptoms were due to inflammation in her feet.  She had ultrasound to her feet which did not help.  She also had MRI which showed plantar fasciitis.  She was evaluated by Dr. Elsa who did not recommend surgery and advised boot and oral steroids without much help.  She states her symptoms eventually resolved about 4 months ago.  Currently she does not have any discomfort in her feet.  She has been having pain and discomfort off-and-on in her hands for the last 4 years.  She notices decreased grip strength.  She has constant discomfort in her left CMC joint.  She notices occasional swelling in her hands which she describes in all of her PIP and DIP joints.  She states in May 2025 she developed left elbow pain, swelling and her elbow was locked for about 6 weeks.  The symptoms resolved without taking any medications.  She continues to have some discomfort in her neck, hands, occasional discomfort in her right hip and intermittent discomfort in her feet.  She has never been diagnosed psoriasis but she has noticed some rash.  She gives  history of Raynaud's phenomenon during winter months. She is right-handed retired.  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  She enjoys hiking, walking, sewing, knitting and cooking.  She states becomes very difficult for her to do her normal walking when her symptoms flare.  She is single, gravida 0, no history of DVTs.  She drinks alcoholic beverages 1 or 2/week.  She has never been a smoker.  There is family history of psoriatic arthritis in her maternal aunt.    Activities of Daily Living:  Patient reports morning stiffness for 5-10 minutes.   Patient Denies nocturnal pain.  Difficulty dressing/grooming: Denies Difficulty climbing stairs: Denies Difficulty getting out of chair: Denies Difficulty using hands for taps, buttons, cutlery, and/or writing: Reports  Review of Systems  Constitutional:  Positive for fatigue.  HENT:  Negative for mouth sores and mouth dryness.   Eyes:  Negative for dryness.  Respiratory:  Negative for shortness of breath.   Cardiovascular:  Negative for chest pain and palpitations.  Gastrointestinal:  Negative for blood in stool, constipation and diarrhea.  Endocrine: Negative for increased urination.  Genitourinary:  Negative for involuntary urination.  Musculoskeletal:  Positive for joint pain, gait problem, joint pain, joint swelling, myalgias, morning stiffness, muscle tenderness and myalgias. Negative  for muscle weakness.  Skin:  Positive for color change and rash. Negative for hair loss and sensitivity to sunlight.  Allergic/Immunologic: Negative for susceptible to infections.  Neurological:  Positive for numbness. Negative for dizziness and headaches.  Hematological:  Negative for swollen glands.  Psychiatric/Behavioral:  Positive for sleep disturbance. Negative for depressed mood. The patient is not nervous/anxious.     PMFS History:  Patient Active Problem List   Diagnosis Date Noted   Arthralgia 12/29/2023    Right foot pain 12/09/2022   Allergic rhinitis 02/13/2020    Past Medical History:  Diagnosis Date   Allergic rhinitis, cause unspecified    Claritin, Flonase seasonally   Exercise-induced asthma    related to weather changes/allergies; had normal stress echo for evaluation of DOE   Palpitations    many years ago    Family History  Problem Relation Age of Onset   Hypertension Mother    Hyperlipidemia Mother    Stroke Father 49   Hypertension Father    Heart disease Father        CHF   Parkinsonism Father    Hypertension Sister    Hypertension Sister    COPD Sister    Hypertension Brother    Migraines Brother    Diabetes Maternal Uncle    Parkinsonism Maternal Uncle    Diabetes Maternal Grandmother    Heart disease Maternal Grandmother    Stroke Maternal Grandmother    Diabetes Maternal Grandfather    Stroke Maternal Grandfather 20   Heart disease Paternal Grandmother    Colon cancer Neg Hx    Colon polyps Neg Hx    Esophageal cancer Neg Hx    Rectal cancer Neg Hx    Stomach cancer Neg Hx    Past Surgical History:  Procedure Laterality Date   BREAST BIOPSY Right 2010   THYROID  CYST EXCISION  2017   TONSILLECTOMY  1985   uterine cyst removal     Social History   Tobacco Use   Smoking status: Never    Passive exposure: Never   Smokeless tobacco: Never  Vaping Use   Vaping status: Never Used  Substance Use Topics   Alcohol use: Yes    Alcohol/week: 4.0 standard drinks of alcohol    Types: 4 Glasses of wine per week    Comment: 3-4 drinks per week.   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      Tobacco: none      Alcohol: 3-5 alcoholic beverages per week      Exercise: hike, walking, biking, running, yoga; daily exercise; gym membership       Seatbelt: 100%      Sexual activity: in 2019, no sexual activity since 2014.       Immunization History  Administered Date(s) Administered   Influenza Inj Mdck Quad With Preservative 12/21/2017   Influenza, Seasonal, Injecte, Preservative Fre 12/25/2015, 12/29/2023   Influenza,inj,Quad PF,6+ Mos 01/25/2020   Influenza-Unspecified 12/28/2016, 12/30/2020, 01/01/2022   PFIZER(Purple Top)SARS-COV-2 Vaccination 06/05/2019, 07/10/2019, 01/25/2020   PNEUMOCOCCAL CONJUGATE-20 12/29/2023   Tdap 01/29/2007, 04/26/2017   Zoster Recombinant(Shingrix) 02/13/2020, 07/03/2020     Objective: Vital Signs: BP 109/75 (BP Location: Right Arm, Patient Position: Sitting, Cuff Size:  Normal)   Pulse 60   Temp 97.6 F (36.4 C)   Resp 15   Ht 5' 8 (1.727 m)   Wt 156 lb 6.4 oz (70.9 kg)   LMP 12/11/2010   BMI 23.78 kg/m    Physical Exam Vitals and nursing note reviewed.  Constitutional:      Appearance: She is well-developed.  HENT:     Head: Normocephalic and atraumatic.  Eyes:     Conjunctiva/sclera: Conjunctivae normal.  Cardiovascular:     Rate and Rhythm: Normal rate and regular rhythm.     Heart sounds: Normal heart sounds.  Pulmonary:     Effort: Pulmonary effort is normal.     Breath sounds: Normal breath sounds.  Abdominal:     General: Bowel sounds are normal.     Palpations: Abdomen is soft.  Musculoskeletal:     Cervical back: Normal range of motion.  Lymphadenopathy:     Cervical: No cervical adenopathy.  Skin:    General: Skin is warm and dry.     Capillary Refill: Capillary refill takes less than 2 seconds.  Neurological:     Mental Status: She is alert and oriented to person, place, and time.  Psychiatric:        Behavior: Behavior normal.      Musculoskeletal Exam: Cervical, thoracic and lumbar spine were in good range of motion.  There was no SI joint tenderness.  Shoulder joints, elbow joints, wrist joints, MCPs, PIPs and DIPs were in good range of motion with no  synovitis.  Bilateral CMC prominence was noted.  No synovitis was noted.  Hip joints and knee joints were in good range of motion without any warmth swelling or effusion.  There was no tenderness over ankles or MTPs.  Dorsal spurring was noted.  No synovitis was noted.  CDAI Exam: CDAI Score: -- Patient Global: --; Provider Global: -- Swollen: --; Tender: -- Joint Exam 12/31/2023   No joint exam has been documented for this visit   There is currently no information documented on the homunculus. Go to the Rheumatology activity and complete the homunculus joint exam.  Investigation: No additional findings.  Imaging: XR Foot 2 Views Left Result Date: 12/31/2023 PIP and DIP narrowing was noted.  Inferior calcaneal spur was noted.  Dorsal spurring was noted.  No intertarsal narrowing was noted.  No erosive changes were noted. Impression: The x-rays were suggestive of osteoarthritis.  XR Foot 2 Views Right Result Date: 12/31/2023 PIP and DIP narrowing was noted.  Inferior calcaneal spur was noted.  No intertarsal narrowing was noted.  No erosive changes were noted. Impression: The x-rays were suggestive of osteoarthritis.  XR Hand 2 View Left Result Date: 12/31/2023 PIP and DIP narrowing was noted.  CMC narrowing and subluxation was noted.  No MCP, intercarpal or radiocarpal joint space narrowing was noted.  No erosive changes were noted. 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  XR Hand 2 View Right Result Date: 12/31/2023 PIP and DIP narrowing was noted.  CMC narrowing and subluxation was noted.  No MCP, intercarpal or radiocarpal joint space narrowing was noted.  No erosive changes were noted. 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   Recent Labs: Lab Results  Component Value Date   WBC 5.8 12/30/2023   HGB 12.9 12/30/2023   PLT 238.0 12/30/2023   NA 139 12/30/2023   K 4.2 12/30/2023   CL 104 12/30/2023   CO2 25  12/30/2023  GLUCOSE 89 12/30/2023   BUN 14 12/30/2023   CREATININE 0.64 12/30/2023   BILITOT 1.2 12/30/2023   ALKPHOS 83 12/30/2023   AST 17 12/30/2023   ALT 12 12/30/2023   PROT 6.4 12/30/2023   ALBUMIN 4.3 12/30/2023   CALCIUM 9.6 12/30/2023    Speciality Comments: No specialty comments available.  Procedures:  No procedures performed Allergies: Patient has no known allergies.   Assessment / Plan:     Visit Diagnoses: Polyarthralgia-patient has pain and discomfort in multiple joints intermittently.  Pain in both hands -she states she been having stiffness in her both hands for the last 3 to 4 years.  She has also noticed decreased grip strength.  She complains of discomfort in the left [facility] joint.  Bilateral CMC thickening was noted.  No synovitis was noted.  Plan: XR Hand 2 View Right, XR Hand 2 View Left.  X-rays of bilateral hands showed bilateral CMC narrowing and subluxation.  PIP and DIP narrowing was noted.  X-ray findings were reviewed with the patient.  Joint protection and muscle strengthening was discussed.  A handout  Pain in left elbow-patient gives history of locking of her left elbow pain inflammation in May 2025 which lasted for about 6 weeks and then resolved by itself.  She had good range of motion of her left elbow today without any synovitis.  Chronic pain of right ankle - Patient was referred to Dr. Elsa patient had I reviewed previous MRI of the ankles with patient which showed tenosynovitis.  She recently had some autoimmune labs with her PCP.  Will discuss results at the follow-up visit.  Pain in both feet -she complains of discomfort in her feet intermittently for the last 4 years.  She had recurrent plantar fasciitis for many years.  She has been symptom-free for the last few months.  She had mild tenderness over MTPs when she walked.  No synovitis was noted.  Plan: XR Foot 2 Views Right, XR Foot 2 Views Left.  X-rays of bilateral feet were suggestive of  osteoarthritis.  Bilateral inferior calcaneal spur and dorsal spurring was noted.  Plantar fasciitis of right foot - Treated by Dr. Joane with shockwave therapy  Seborrheic keratosis - Followed by Dr. Alm  Seasonal allergic rhinitis due to other allergic trigger  Orders: Orders Placed This Encounter  Procedures   XR Hand 2 View Right   XR Hand 2 View Left   XR Foot 2 Views Right   XR Foot 2 Views Left   No orders of the defined types were placed in this encounter.   Follow-Up Instructions: Return for Osteoarthritis.   Maya Nash, MD  Note - This record has been created using Animal nutritionist.  Chart creation errors have been sought, but may not always  have been located. Such creation errors do not reflect on  the standard of medical care.

## 2023-12-30 ENCOUNTER — Ambulatory Visit: Payer: Self-pay | Admitting: Internal Medicine

## 2023-12-30 DIAGNOSIS — M2550 Pain in unspecified joint: Secondary | ICD-10-CM | POA: Diagnosis not present

## 2023-12-30 LAB — COMPREHENSIVE METABOLIC PANEL WITH GFR
ALT: 12 U/L (ref 0–35)
AST: 17 U/L (ref 0–37)
Albumin: 4.3 g/dL (ref 3.5–5.2)
Alkaline Phosphatase: 83 U/L (ref 39–117)
BUN: 14 mg/dL (ref 6–23)
CO2: 25 meq/L (ref 19–32)
Calcium: 9.6 mg/dL (ref 8.4–10.5)
Chloride: 104 meq/L (ref 96–112)
Creatinine, Ser: 0.64 mg/dL (ref 0.40–1.20)
GFR: 94.7 mL/min (ref >60.00)
Glucose, Bld: 89 mg/dL (ref 70–99)
Potassium: 4.2 meq/L (ref 3.5–5.1)
Sodium: 139 meq/L (ref 135–145)
Total Bilirubin: 1.2 mg/dL (ref 0.2–1.2)
Total Protein: 6.4 g/dL (ref 6.0–8.3)

## 2023-12-30 LAB — CBC
HCT: 37.8 % (ref 36.0–46.0)
Hemoglobin: 12.9 g/dL (ref 12.0–15.0)
MCHC: 34 g/dL (ref 30.0–36.0)
MCV: 93.9 fl (ref 78.0–100.0)
Platelets: 238 K/uL (ref 150.0–400.0)
RBC: 4.03 Mil/uL (ref 3.87–5.11)
RDW: 13.2 % (ref 11.5–15.5)
WBC: 5.8 K/uL (ref 4.0–10.5)

## 2023-12-30 LAB — LIPID PANEL
Cholesterol: 197 mg/dL (ref 0–200)
HDL: 72.6 mg/dL (ref >39.00)
LDL Cholesterol: 111 mg/dL — ABNORMAL HIGH (ref 0–99)
NonHDL: 124.37
Total CHOL/HDL Ratio: 3
Triglycerides: 67 mg/dL (ref 0.0–149.0)
VLDL: 13.4 mg/dL (ref 0.0–40.0)

## 2023-12-30 LAB — SEDIMENTATION RATE: Sed Rate: 5 mm/h (ref 0–30)

## 2023-12-30 LAB — C-REACTIVE PROTEIN: CRP: <0.5 mg/dL (ref 0.5–20.0)

## 2023-12-30 LAB — TSH: TSH: 2.05 u[IU]/mL (ref 0.35–5.50)

## 2023-12-31 ENCOUNTER — Encounter: Payer: Self-pay | Admitting: Rheumatology

## 2023-12-31 ENCOUNTER — Ambulatory Visit

## 2023-12-31 ENCOUNTER — Ambulatory Visit: Attending: Rheumatology | Admitting: Rheumatology

## 2023-12-31 VITALS — BP 109/75 | HR 60 | Temp 97.6°F | Resp 15 | Ht 68.0 in | Wt 156.4 lb

## 2023-12-31 DIAGNOSIS — M79642 Pain in left hand: Secondary | ICD-10-CM

## 2023-12-31 DIAGNOSIS — M79641 Pain in right hand: Secondary | ICD-10-CM

## 2023-12-31 DIAGNOSIS — M25522 Pain in left elbow: Secondary | ICD-10-CM | POA: Diagnosis not present

## 2023-12-31 DIAGNOSIS — M79672 Pain in left foot: Secondary | ICD-10-CM | POA: Diagnosis not present

## 2023-12-31 DIAGNOSIS — M79671 Pain in right foot: Secondary | ICD-10-CM

## 2023-12-31 DIAGNOSIS — M25571 Pain in right ankle and joints of right foot: Secondary | ICD-10-CM | POA: Diagnosis not present

## 2023-12-31 DIAGNOSIS — M722 Plantar fascial fibromatosis: Secondary | ICD-10-CM

## 2023-12-31 DIAGNOSIS — L821 Other seborrheic keratosis: Secondary | ICD-10-CM

## 2023-12-31 DIAGNOSIS — J3089 Other allergic rhinitis: Secondary | ICD-10-CM

## 2023-12-31 DIAGNOSIS — G8929 Other chronic pain: Secondary | ICD-10-CM

## 2023-12-31 DIAGNOSIS — M2550 Pain in unspecified joint: Secondary | ICD-10-CM

## 2023-12-31 NOTE — Patient Instructions (Signed)

## 2024-01-02 LAB — ANTI-NUCLEAR AB-TITER (ANA TITER)
ANA TITER: 1:40 {titer} — ABNORMAL HIGH
ANA Titer 1: 1:40 {titer} — ABNORMAL HIGH

## 2024-01-02 LAB — ANA: Anti Nuclear Antibody (ANA): POSITIVE — AB

## 2024-01-02 LAB — CYCLIC CITRUL PEPTIDE ANTIBODY, IGG: Cyclic Citrullin Peptide Ab: <16 U

## 2024-01-02 LAB — RHEUMATOID FACTOR: Rheumatoid fact SerPl-aCnc: 10 [IU]/mL (ref <14)

## 2024-01-02 LAB — HLA-B27 ANTIGEN: HLA-B27 Antigen: NEGATIVE

## 2024-01-18 NOTE — Progress Notes (Signed)
 Office Visit Note  Patient: Kristen Wilkerson             Date of Birth: 1960/09/25           MRN: [id number]             PCP: Geofm Glade PARAS, MD Referring: Geofm Glade PARAS, MD Visit Date: 01/27/2024 Occupation: Data Unavailable  Subjective:  Pain in multiple joints  History of Present Illness: Kristen Wilkerson is a 63 y.o. female returns today after her initial evaluation for polyarthralgia.  She states for the last 6 weeks she is doing better.  She denies any joint swelling or discomfort today.  She states the elbow joint pain has resolved.  She is not having any discomfort in the right ankle.  She continues to have some stiffness in her ankles and her feet.  She had no recurrence from plantar fasciitis recently    Activities of Daily Living:  Patient reports morning stiffness for 10-15 minutes.   Patient Denies nocturnal pain.  Difficulty dressing/grooming: Denies Difficulty climbing stairs: Denies Difficulty getting out of chair: Denies Difficulty using hands for taps, buttons, cutlery, and/or writing: Reports  Review of Systems  Constitutional:  Negative for fatigue.  HENT:  Negative for mouth sores and mouth dryness.   Eyes:  Negative for dryness.  Respiratory:  Negative for shortness of breath.   Cardiovascular:  Negative for chest pain and palpitations.  Gastrointestinal:  Negative for blood in stool, constipation and diarrhea.  Endocrine: Negative for increased urination.  Genitourinary:  Negative for involuntary urination.  Musculoskeletal:  Positive for joint pain, joint pain, joint swelling and morning stiffness. Negative for gait problem, myalgias, muscle weakness, muscle tenderness and myalgias.  Skin:  Positive for color change and rash. Negative for hair loss and sensitivity to sunlight.  Allergic/Immunologic: Negative for susceptible to infections.  Neurological:  Positive for numbness. Negative for dizziness and headaches.  Hematological:  Negative for swollen  glands.  Psychiatric/Behavioral:  Positive for sleep disturbance. Negative for depressed mood. The patient is not nervous/anxious.     PMFS History:  Patient Active Problem List   Diagnosis Date Noted   Arthralgia 12/29/2023   Right foot pain 12/09/2022   Allergic rhinitis 02/13/2020    Past Medical History:  Diagnosis Date   Allergic rhinitis, cause unspecified    Claritin, Flonase seasonally   Exercise-induced asthma    related to weather changes/allergies; had normal stress echo for evaluation of DOE   Palpitations    many years ago    Family History  Problem Relation Age of Onset   Hypertension Mother    Hyperlipidemia Mother    Stroke Father 66   Hypertension Father    Heart disease Father        CHF   Parkinsonism Father    Hypertension Sister    Hypertension Sister    COPD Sister    Hypertension Brother    Migraines Brother    Diabetes Maternal Uncle    Parkinsonism Maternal Uncle    Diabetes Maternal Grandmother    Heart disease Maternal Grandmother    Stroke Maternal Grandmother    Diabetes Maternal Grandfather    Stroke Maternal Grandfather 22   Heart disease Paternal Grandmother    Colon cancer Neg Hx    Colon polyps Neg Hx    Esophageal cancer Neg Hx    Rectal cancer Neg Hx    Stomach cancer Neg Hx    Past Surgical History:  Procedure  Laterality Date   BREAST BIOPSY Right 2010   THYROID  CYST EXCISION  2017   TONSILLECTOMY  1985   uterine cyst removal     Social History   Tobacco Use   Smoking status: Never    Passive exposure: Never   Smokeless tobacco: Never  Vaping Use   Vaping status: Never Used  Substance Use Topics   Alcohol use: Yes    Alcohol/week: 4.0 standard drinks of alcohol    Types: 4 Glasses of wine per week    Comment: occ   Drug use: No   Social History   Social History Narrative   Marital status: single; not dating in 2019      Children: none      Lives alone      Employment: production designer, theatre/television/film for [employer]; 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      Tobacco: none      Alcohol: 3-5 alcoholic beverages per week      Exercise: hike, walking, biking, running, yoga; daily exercise; gym membership      Seatbelt: 100%      Sexual activity: in 2019, no sexual activity since 2014.       Immunization History  Administered Date(s) Administered   Influenza Inj Mdck Quad With Preservative 12/21/2017   Influenza, Seasonal, Injecte, Preservative Fre 12/25/2015, 12/29/2023   Influenza,inj,Quad PF,6+ Mos 01/25/2020   Influenza-Unspecified 12/28/2016, 12/30/2020, 01/01/2022   PFIZER(Purple Top)SARS-COV-2 Vaccination 06/05/2019, 07/10/2019, 01/25/2020   PNEUMOCOCCAL CONJUGATE-20 12/29/2023   Tdap 01/29/2007, 04/26/2017   Zoster Recombinant(Shingrix) 02/13/2020, 07/03/2020     Objective: Vital Signs: BP 116/77   Pulse 60   Temp (!) 97.5 F (36.4 C)   Resp 14   Ht 5' 9 (1.753 m)   Wt 152 lb 12.8 oz (69.3 kg)   LMP 12/11/2010   BMI 22.56 kg/m    Physical Exam Vitals and nursing note reviewed.  Constitutional:      Appearance: She is well-developed.  HENT:     Head: Normocephalic and atraumatic.  Eyes:     Conjunctiva/sclera: Conjunctivae normal.  Cardiovascular:     Rate and Rhythm: Normal rate and regular rhythm.     Heart sounds: Normal heart sounds.  Pulmonary:     Effort: Pulmonary effort is normal.     Breath sounds: Normal breath sounds.  Abdominal:     General: Bowel sounds are normal.     Palpations: Abdomen is soft.  Musculoskeletal:     Cervical back: Normal range of motion.  Lymphadenopathy:     Cervical: No cervical adenopathy.  Skin:    General: Skin is warm and dry.     Capillary Refill: Capillary refill takes less than 2 seconds.  Neurological:     Mental Status: She is alert and oriented to person, place, and time.  Psychiatric:        Behavior: Behavior normal.      Musculoskeletal Exam: Cervical, thoracic and lumbar spine were in good  range of motion.  There was no SI joint tenderness.  Shoulder joints, elbow joints, wrist joints, MCPs, PIPs and DIPs were in good range of motion with no synovitis.  Hip joints and knee joints were in good range of motion without any warmth swelling or effusion.  There was no tenderness over ankles or MTPs.   CDAI Exam: CDAI Score: -- Patient Global: --; Provider Global: -- Swollen: --; Tender: -- Joint Exam 01/27/2024   No joint exam has been documented  for this visit   There is currently no information documented on the homunculus. Go to the Rheumatology activity and complete the homunculus joint exam.  Investigation: No additional findings.  Imaging: XR Foot 2 Views Left Result Date: 12/31/2023 PIP and DIP narrowing was noted.  Inferior calcaneal spur was noted.  Dorsal spurring was noted.  No intertarsal narrowing was noted.  No erosive changes were noted. Impression: The x-rays were suggestive of osteoarthritis.  XR Foot 2 Views Right Result Date: 12/31/2023 PIP and DIP narrowing was noted.  Inferior calcaneal spur was noted.  No intertarsal narrowing was noted.  No erosive changes were noted. Impression: The x-rays were suggestive of osteoarthritis.  XR Hand 2 View Left Result Date: 12/31/2023 PIP and DIP narrowing was noted.  CMC narrowing and subluxation was noted.  No MCP, intercarpal or radiocarpal joint space narrowing was noted.  No erosive changes were noted. 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  XR Hand 2 View Right Result Date: 12/31/2023 PIP and DIP narrowing was noted.  CMC narrowing and subluxation was noted.  No MCP, intercarpal or radiocarpal joint space narrowing was noted.  No erosive changes were noted. 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   Recent Labs: Lab Results  Component Value Date   WBC 5.8 12/30/2023   HGB 12.9 12/30/2023   PLT 238.0 12/30/2023   NA 139 12/30/2023   K 4.2  12/30/2023   CL 104 12/30/2023   CO2 25 12/30/2023   GLUCOSE 89 12/30/2023   BUN 14 12/30/2023   CREATININE 0.64 12/30/2023   BILITOT 1.2 12/30/2023   ALKPHOS 83 12/30/2023   AST 17 12/30/2023   ALT 12 12/30/2023   PROT 6.4 12/30/2023   ALBUMIN 4.3 12/30/2023   CALCIUM 9.6 12/30/2023     Speciality Comments: No specialty comments available.  Procedures:  No procedures performed Allergies: Patient has no known allergies.   Assessment / Plan:     Visit Diagnoses: Polyarthralgia-patient states her arthralgias have improved since the last visit.  She states for the last 6 weeks she has not had much discomfort.  She continues to have some stiffness in her hands and her feet.December 30, 2023 LDL 111, sed rate 5, CRP<0.5, TSH normal, RF negative, anti-CCP negative, ANA 1: 40 NS, 1: 40 cytoplasmic, HLA-B27 negative.  Lab results were reviewed with the patient.  ANA is low titer and not significant.  I advised her to contact me if she develops increased joint symptoms.  Pain in both hands -she complains of some stiffness and discomfort in her hands.  No synovitis was noted.  X-rays obtained at the last visit were suggestive of osteoarthritis.  A handout on hand exercises was given.  Pain in left elbow - History of left elbow locking in May 2025 for about 6 weeks.  She had no recurrence of left elbow symptoms.  No synovitis or tenosynovitis was noted on the examination today.  Chronic pain of right ankle -currently asymptomatic.  MRI of the ankles showed tenosynovitis per patient.  She is followed by Dr. Elsa.  Pain in both feet -she has a stiffness in her feet.  No swelling was noted on examination today.  No tenderness over MTPs are noted.  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  Bilateral inferior calcaneal spur and dorsal spurring was noted.  Plantar fasciitis of right foot -she had no recent episodes of plantar fasciitis.  She is followed by Dr. Joane.  She had shockwave  therapy in the past.  Seborrheic keratosis - Followed by Dr. Alm.  Seasonal allergic rhinitis due to other allergic trigger  Orders: No orders of the defined types were placed in this encounter.  No orders of the defined types were placed in this encounter.    Follow-Up Instructions: Return in about 6 months (around 07/27/2024) for polyarthralgia.   Maya Nash, MD  Note - This record has been created using Animal nutritionist.  Chart creation errors have been sought, but may not always  have been located. Such creation errors do not reflect on  the standard of medical care.

## 2024-01-24 ENCOUNTER — Ambulatory Visit: Payer: BC Managed Care – PPO | Admitting: Dermatology

## 2024-01-25 ENCOUNTER — Ambulatory Visit: Payer: BC Managed Care – PPO | Admitting: Dermatology

## 2024-01-27 ENCOUNTER — Ambulatory Visit: Attending: Rheumatology | Admitting: Rheumatology

## 2024-01-27 ENCOUNTER — Encounter: Payer: Self-pay | Admitting: Rheumatology

## 2024-01-27 ENCOUNTER — Other Ambulatory Visit: Payer: Self-pay | Admitting: Internal Medicine

## 2024-01-27 VITALS — BP 116/77 | HR 60 | Temp 97.5°F | Resp 14 | Ht 69.0 in | Wt 152.8 lb

## 2024-01-27 DIAGNOSIS — M25571 Pain in right ankle and joints of right foot: Secondary | ICD-10-CM | POA: Diagnosis not present

## 2024-01-27 DIAGNOSIS — M722 Plantar fascial fibromatosis: Secondary | ICD-10-CM

## 2024-01-27 DIAGNOSIS — M79642 Pain in left hand: Secondary | ICD-10-CM

## 2024-01-27 DIAGNOSIS — M79672 Pain in left foot: Secondary | ICD-10-CM

## 2024-01-27 DIAGNOSIS — M25522 Pain in left elbow: Secondary | ICD-10-CM

## 2024-01-27 DIAGNOSIS — G8929 Other chronic pain: Secondary | ICD-10-CM

## 2024-01-27 DIAGNOSIS — M79671 Pain in right foot: Secondary | ICD-10-CM

## 2024-01-27 DIAGNOSIS — M79641 Pain in right hand: Secondary | ICD-10-CM

## 2024-01-27 DIAGNOSIS — L821 Other seborrheic keratosis: Secondary | ICD-10-CM

## 2024-01-27 DIAGNOSIS — M2550 Pain in unspecified joint: Secondary | ICD-10-CM

## 2024-01-27 DIAGNOSIS — Z1231 Encounter for screening mammogram for malignant neoplasm of breast: Secondary | ICD-10-CM

## 2024-01-27 DIAGNOSIS — J3089 Other allergic rhinitis: Secondary | ICD-10-CM

## 2024-01-27 NOTE — Patient Instructions (Signed)

## 2024-02-10 ENCOUNTER — Ambulatory Visit
Admission: RE | Admit: 2024-02-10 | Discharge: 2024-02-10 | Disposition: A | Discharge status: Home / Self Care | Source: Ambulatory Visit | Attending: Internal Medicine | Admitting: Internal Medicine

## 2024-02-10 DIAGNOSIS — Z1231 Encounter for screening mammogram for malignant neoplasm of breast: Secondary | ICD-10-CM

## 2024-06-21 ENCOUNTER — Ambulatory Visit: Admitting: Dermatology

## 2024-07-27 ENCOUNTER — Ambulatory Visit: Admitting: Rheumatology

## 2024-12-29 ENCOUNTER — Encounter: Admitting: Internal Medicine
